# Patient Record
Sex: Male | Born: 1955 | Race: White | Hispanic: No | Marital: Married | Smoking: Never smoker
Health system: Southern US, Community
[De-identification: ages and names within clinical notes are randomized; demographics above are authoritative.]

## PROBLEM LIST (undated history)

## (undated) DIAGNOSIS — E079 Disorder of thyroid, unspecified: Secondary | ICD-10-CM

## (undated) HISTORY — PX: FRACTURE SURGERY: SHX138

## (undated) HISTORY — PX: APPENDECTOMY: SHX54

---

## 2013-01-22 ENCOUNTER — Encounter (HOSPITAL_BASED_OUTPATIENT_CLINIC_OR_DEPARTMENT_OTHER): Payer: Self-pay | Admitting: Emergency Medicine

## 2013-01-22 ENCOUNTER — Emergency Department (HOSPITAL_BASED_OUTPATIENT_CLINIC_OR_DEPARTMENT_OTHER)
Admission: EM | Admit: 2013-01-22 | Discharge: 2013-01-22 | Disposition: A | Discharge status: Home / Self Care | Payer: BC Managed Care – PPO | Attending: Emergency Medicine | Admitting: Emergency Medicine

## 2013-01-22 ENCOUNTER — Emergency Department (HOSPITAL_BASED_OUTPATIENT_CLINIC_OR_DEPARTMENT_OTHER): Payer: BC Managed Care – PPO

## 2013-01-22 DIAGNOSIS — E079 Disorder of thyroid, unspecified: Secondary | ICD-10-CM | POA: Insufficient documentation

## 2013-01-22 DIAGNOSIS — R1013 Epigastric pain: Secondary | ICD-10-CM

## 2013-01-22 DIAGNOSIS — Z79899 Other long term (current) drug therapy: Secondary | ICD-10-CM | POA: Insufficient documentation

## 2013-01-22 DIAGNOSIS — K279 Peptic ulcer, site unspecified, unspecified as acute or chronic, without hemorrhage or perforation: Secondary | ICD-10-CM

## 2013-01-22 HISTORY — DX: Disorder of thyroid, unspecified: E07.9

## 2013-01-22 LAB — CBC WITH DIFFERENTIAL/PLATELET
Basophils Absolute: 0 10*3/uL (ref 0.0–0.1)
Basophils Relative: 0 % (ref 0–1)
EOS PCT: 1 % (ref 0–5)
Eosinophils Absolute: 0.1 10*3/uL (ref 0.0–0.7)
HCT: 44.4 % (ref 39.0–52.0)
Hemoglobin: 16 g/dL (ref 13.0–17.0)
LYMPHS PCT: 13 % (ref 12–46)
Lymphs Abs: 1.4 10*3/uL (ref 0.7–4.0)
MCH: 32.5 pg (ref 26.0–34.0)
MCHC: 36 g/dL (ref 30.0–36.0)
MCV: 90.2 fL (ref 78.0–100.0)
Monocytes Absolute: 0.9 10*3/uL (ref 0.1–1.0)
Monocytes Relative: 8 % (ref 3–12)
NEUTROS ABS: 8.2 10*3/uL — AB (ref 1.7–7.7)
Neutrophils Relative %: 78 % — ABNORMAL HIGH (ref 43–77)
PLATELETS: 181 10*3/uL (ref 150–400)
RBC: 4.92 MIL/uL (ref 4.22–5.81)
RDW: 12.6 % (ref 11.5–15.5)
WBC: 10.6 10*3/uL — ABNORMAL HIGH (ref 4.0–10.5)

## 2013-01-22 LAB — COMPREHENSIVE METABOLIC PANEL
ALBUMIN: 4.4 g/dL (ref 3.5–5.2)
ALT: 36 U/L (ref 0–53)
AST: 36 U/L (ref 0–37)
Alkaline Phosphatase: 47 U/L (ref 39–117)
BUN: 19 mg/dL (ref 6–23)
CO2: 24 mEq/L (ref 19–32)
Calcium: 9.1 mg/dL (ref 8.4–10.5)
Chloride: 101 mEq/L (ref 96–112)
Creatinine, Ser: 0.9 mg/dL (ref 0.50–1.35)
GFR calc Af Amer: >90 mL/min (ref >90)
GFR calc non Af Amer: >90 mL/min (ref >90)
Glucose, Bld: 117 mg/dL — ABNORMAL HIGH (ref 70–99)
Potassium: 3.9 mEq/L (ref 3.7–5.3)
Sodium: 139 mEq/L (ref 137–147)
TOTAL PROTEIN: 7.3 g/dL (ref 6.0–8.3)
Total Bilirubin: 1.1 mg/dL (ref 0.3–1.2)

## 2013-01-22 LAB — LIPASE, BLOOD: LIPASE: 20 U/L (ref 11–59)

## 2013-01-22 LAB — URINALYSIS, ROUTINE W REFLEX MICROSCOPIC
BILIRUBIN URINE: NEGATIVE
Glucose, UA: NEGATIVE mg/dL
Hgb urine dipstick: NEGATIVE
Ketones, ur: 15 mg/dL — AB
Leukocytes, UA: NEGATIVE
NITRITE: NEGATIVE
PROTEIN: NEGATIVE mg/dL
Specific Gravity, Urine: 1.024 (ref 1.005–1.030)
UROBILINOGEN UA: 1 mg/dL (ref 0.0–1.0)
pH: 5.5 (ref 5.0–8.0)

## 2013-01-22 MED ORDER — FAMOTIDINE IN NACL 20-0.9 MG/50ML-% IV SOLN
INTRAVENOUS | Status: AC
  Administered 2013-01-22: 10 mg
  Filled 2013-01-22: qty 50

## 2013-01-22 MED ORDER — GI COCKTAIL ~~LOC~~
30.0000 mL | Freq: Once | ORAL | Status: AC
  Administered 2013-01-22: 30 mL via ORAL
  Filled 2013-01-22: qty 30

## 2013-01-22 MED ORDER — HYDROMORPHONE HCL PF 1 MG/ML IJ SOLN
1.0000 mg | Freq: Once | INTRAMUSCULAR | Status: AC
  Administered 2013-01-22: 1 mg via INTRAVENOUS

## 2013-01-22 MED ORDER — SODIUM CHLORIDE 0.9 % IV SOLN
10.0000 mg | Freq: Once | INTRAVENOUS | Status: AC
  Filled 2013-01-22: qty 1

## 2013-01-22 MED ORDER — PANTOPRAZOLE SODIUM 20 MG PO TBEC: 20.0000 mg | DELAYED_RELEASE_TABLET | Freq: Every day | ORAL | Status: DC

## 2013-01-22 MED ORDER — HYDROMORPHONE HCL PF 1 MG/ML IJ SOLN
1.0000 mg | Freq: Once | INTRAMUSCULAR | Status: AC
  Administered 2013-01-22: 1 mg via INTRAVENOUS
  Filled 2013-01-22: qty 1

## 2013-01-22 MED ORDER — SODIUM CHLORIDE 0.9 % IV BOLUS (SEPSIS)
1000.0000 mL | Freq: Once | INTRAVENOUS | Status: AC
  Administered 2013-01-22: 1000 mL via INTRAVENOUS

## 2013-01-22 MED ORDER — HYDROCODONE-ACETAMINOPHEN 5-325 MG PO TABS: 1.0000 | ORAL_TABLET | Freq: Four times a day (QID) | ORAL | Status: DC | PRN

## 2013-01-22 MED ORDER — MORPHINE SULFATE 4 MG/ML IJ SOLN
4.0000 mg | Freq: Once | INTRAMUSCULAR | Status: AC
  Administered 2013-01-22: 4 mg via INTRAVENOUS
  Filled 2013-01-22: qty 1

## 2013-01-22 MED ORDER — HYDROMORPHONE HCL PF 1 MG/ML IJ SOLN
INTRAMUSCULAR | Status: AC
  Administered 2013-01-22: 1 mg via INTRAVENOUS
  Filled 2013-01-22: qty 1

## 2013-01-22 NOTE — ED Provider Notes (Signed)
CSN: [id number]     Arrival date & time 01/22/13  1938 History  This chart was scribed for Ronnie Kaplan, MD by Ronal Fear, ED Scribe. This patient was seen in room MH03/MH03 and the patient's care was started at 8:29 PM.    Chief Complaint  Patient presents with  . Abdominal Pain   (Consider location/radiation/quality/duration/timing/severity/associated sxs/prior Treatment) The history is provided by the patient. No language interpreter was used.   HPI Comments: [name] is a 58 y.o. male who presents to the Emergency Department complaining of constant sudden onset cramping dull non radiating abdominal pain in the epigastric region with associated belching. No prior occurences, ulcers. Pt has a hx of acid reflux but this pain does not feel the same. Pt states that he takes at least 20 Advil in a week and has been doing so for a number of years. He denies bloody stools, steroid use, abdominal surgeries, liver problems or heavy alcohol use.    Past Medical History  Diagnosis Date  . Thyroid disease    History reviewed. No pertinent past surgical history. History reviewed. No pertinent family history. History  Substance Use Topics  . Smoking status: Never Smoker   . Smokeless tobacco: Not on file  . Alcohol Use: Yes     Comment: occasionally    Review of Systems  Allergies  Review of patient's allergies indicates no known allergies.  Home Medications   Current Outpatient Rx  Name  Route  Sig  Dispense  Refill  . levothyroxine (SYNTHROID, LEVOTHROID) 100 MCG tablet   Oral   Take 100 mcg by mouth daily before breakfast.          BP 136/82  Pulse 63  Temp(Src) 97.9 F (36.6 C) (Oral)  Resp 18  Ht 5\' 9"  (1.753 m)  Wt 200 lb (90.719 kg)  BMI 29.52 kg/m2  SpO2 100% Physical Exam  Nursing note and vitals reviewed. Constitutional: He is oriented to person, place, and time. He appears well-developed and well-nourished. No distress.  HENT:  Head: Normocephalic and  atraumatic.  Eyes: EOM are normal.  Neck: Neck supple. No tracheal deviation present.  Cardiovascular: Normal rate.   Pulmonary/Chest: Effort normal. No respiratory distress.  Abdominal: Soft. There is tenderness. There is no rebound and no guarding.  epigastric tenderness. No flank tenderness  Musculoskeletal: Normal range of motion.  Neurological: He is alert and oriented to person, place, and time.  Skin: Skin is warm and dry.  Psychiatric: He has a normal mood and affect. His behavior is normal.    ED Course  Procedures (including critical care time) DIAGNOSTIC STUDIES: Oxygen Saturation is 100% on RA, normal by my interpretation.    COORDINATION OF CARE: 8:32 PM- Pt advised of plan for treatment including informing the pt of the possible causes for the pain, most likely an ulcer. Pt advised that he should follow up with a gastrologist and will be given medication for pain and pt agrees.    Labs Review Labs Reviewed  URINALYSIS, ROUTINE W REFLEX MICROSCOPIC - Abnormal; Notable for the following:    Ketones, ur 15 (*)    All other components within normal limits   Imaging Review No results found.  EKG Interpretation    Date/Time:  Wednesday January 22 2013 20:01:22 EST Ventricular Rate:  58 PR Interval:  160 QRS Duration: 88 QT Interval:  450 QTC Calculation: 441 R Axis:   11 Text Interpretation:  Sinus bradycardia with sinus arrhythmia Otherwise normal ECG Confirmed  by Ronnie CroftNANAVATI, MD, Ronnie Carr [phone]) on 01/22/2013 9:53:50 PM            MDM  No diagnosis found.  I personally performed the services described in this documentation, which was scribed in my presence. The recorded information has been reviewed and is accurate.   Pt comes in with cc of epigastric abd pain.  DDx includes: Pancreatitis Hepatobiliary pathology including cholecystitis Gastritis/PUD SBO ACS syndrome Aortic Dissection  Pain is epigastric, non radiating, and constant. He has hx of  heavy nsaid use chronically.  Labs are unremarkable, there is no RUQ pain and he has no risk factors for DISSECTION.  Pt informed on return precautions. Will give pain meds, protonix and GI follow up.    Ronnie KaplanAnkit Roseanne Juenger, MD 01/22/13 [phone]

## 2013-01-22 NOTE — Discharge Instructions (Signed)
We saw you in the ER for the abdominal pain. All the results in the ER are normal, labs and imaging. We are not sure what is causing your symptoms - but are highly suspicious that the pain is due to ulcers in the stomach or intestine. Please see the GI doctor as requested.  RETURN TO THE ER IF THERE IS DARK TARRY STOOLS, BLOODY STOOLS OR BLOODY VOMIT.   Diet for Peptic Ulcer Disease Peptic ulcer disease is a term used to describe open sores in the stomach and digestive tract. These sores can be painful, and the pain can be worse when the stomach is empty. These ulcers can lead to bleeding in the esophagus, stomach, and intestines (gastrointestinaltract). Nutrition therapy can help ease the discomfort of peptic ulcer disease.   GENERAL DIET GUIDELINES FOR PEPTIC ULCER DISEASE  Your diet should be rich in fruits, vegetables, and whole grains. It should also be low in fat.   Avoid foods that can irritate your sores.   Avoid foods that are not tolerated or foods that cause pain. This may be different for different people. FOODS AND DRINKS TO AVOID  High-fat dairy and milk products including whole milk, cream, chocolate milk, butter, sour cream, or cream cheese.   High-fat meats and poultry including hot dogs, fried meats or poultry, meats with skin, sausage, or bacon.   Pepper.  Alcohol.  Chocolate.  Tea, coffee, and cola (regular and caffeine).  Lard or hydrogenated oils such as stick margarine.  SAMPLE MEAL PLAN This meal plan is approximately 2000 calories based on https://www.bern[facility].org/ meal planning guidelines.  Breakfast   cup cooked oatmeal.  1 cup strawberries.  1 cup low-fat milk.  1 oz almonds. Snack  1 cup cucumber slices.  6 oz yogurt (made from low-fat or fat-free milk). Lunch  2 slices whole-wheat bread.  2 oz sliced [place].  2 tsp mayonnaise.  1 cup blueberries.  1 cup snap peas. Snack  6 whole-wheat crackers.  1 oz string  cheese. Dinner   cup brown rice.  1 cup mixed veggies.  1 tsp olive oil.  3 oz grilled fish. Document Released: 03/20/2011 Document Reviewed: 03/20/2011 [facility] Patient Information 2014 [place], [place].  Peptic Ulcer A peptic ulcer is a sore in the lining of in your esophagus (esophageal ulcer), stomach (gastric ulcer), or in the first part of your small intestine (duodenal ulcer). The ulcer causes erosion into the deeper tissue. CAUSES  Normally, the lining of the stomach and the small intestine protects itself from the acid that digests food. The protective lining can be damaged by:  An infection caused by a bacterium called Helicobacter pylori (H. pylori).  Regular use of nonsteroidal anti-inflammatory drugs (NSAIDs), such as ibuprofen or aspirin.  Smoking tobacco. Other risk factors include being older than 50, drinking alcohol excessively, and having a family history of ulcer disease.  SYMPTOMS   Burning pain or gnawing in the area between the chest and the belly button.  Heartburn.  Nausea and vomiting.  Bloating. The pain can be worse on an empty stomach and at night. If the ulcer results in bleeding, it can cause:  Black, tarry stools.  Vomiting of bright red blood.  Vomiting of coffee ground looking materials. DIAGNOSIS  A diagnosis is usually made based upon your history and an exam. Other tests and procedures may be performed to find the cause of the ulcer. Finding a cause will help determine the best treatment. Tests and procedures may include:  Blood  tests, stool tests, or breath tests to check for the bacterium H. pylori.  An upper gastrointestinal (GI) series of the esophagus, stomach, and small intestine.  An endoscopy to examine the esophagus, stomach, and small intestine.  A biopsy. TREATMENT  Treatment may include:  Eliminating the cause of the ulcer, such as smoking, NSAIDs, or alcohol.  Medicines to reduce the amount of acid in your  digestive tract.  Antibiotic medicines if the ulcer is caused by the H. pylori bacterium.  An upper endoscopy to treat a bleeding ulcer.  Surgery if the bleeding is severe or if the ulcer created a hole somewhere in the digestive system. HOME CARE INSTRUCTIONS   Avoid tobacco, alcohol, and caffeine. Smoking can increase the acid in the stomach, and continued smoking will impair the healing of ulcers.  Avoid foods and drinks that seem to cause discomfort or aggravate your ulcer.  Only take medicines as directed by your caregiver. Do not substitute over-the-counter medicines for prescription medicines without talking to your caregiver.  Keep any follow-up appointments and tests as directed. SEEK MEDICAL CARE IF:   Your do not improve within 7 days of starting treatment.  You have ongoing indigestion or heartburn. SEEK IMMEDIATE MEDICAL CARE IF:   You have sudden, sharp, or persistent abdominal pain.  You have bloody or dark black, tarry stools.  You vomit blood or vomit that looks like coffee grounds.  You become light headed, weak, or feel faint.  You become sweaty or clammy. MAKE SURE YOU:   Understand these instructions.  Will watch your condition.  Will get help right away if you are not doing well or get worse. Document Released: 12/24/1999 Document Revised: 09/20/2011 Document Reviewed: 07/26/2011 [facility] Patient Information 2014 [place].

## 2013-01-22 NOTE — ED Notes (Signed)
Pt reports mid abdominal pain with sudden onset around 1700, denies nausea, vomiting, diarrhea,

## 2013-01-22 NOTE — ED Notes (Signed)
Pt reports some intermittent sob, denies radiation of pain to back or arm, pt ambulating in room, sitting in different positions attempting to alleviate pain

## 2013-01-22 NOTE — ED Notes (Signed)
MD at bedside. 

## 2013-01-23 ENCOUNTER — Observation Stay (HOSPITAL_COMMUNITY)
Admission: EM | Admit: 2013-01-23 | Discharge: 2013-01-25 | Disposition: A | Discharge status: Home / Self Care | Payer: BC Managed Care – PPO | Attending: Surgery | Admitting: Surgery

## 2013-01-23 ENCOUNTER — Emergency Department (HOSPITAL_COMMUNITY): Payer: BC Managed Care – PPO

## 2013-01-23 ENCOUNTER — Encounter (HOSPITAL_COMMUNITY): Payer: Self-pay | Admitting: Emergency Medicine

## 2013-01-23 DIAGNOSIS — E079 Disorder of thyroid, unspecified: Secondary | ICD-10-CM | POA: Insufficient documentation

## 2013-01-23 DIAGNOSIS — K352 Acute appendicitis with generalized peritonitis, without abscess: Secondary | ICD-10-CM | POA: Insufficient documentation

## 2013-01-23 DIAGNOSIS — K3580 Unspecified acute appendicitis: Secondary | ICD-10-CM

## 2013-01-23 DIAGNOSIS — K3532 Acute appendicitis with perforation and localized peritonitis, without abscess: Secondary | ICD-10-CM | POA: Diagnosis present

## 2013-01-23 DIAGNOSIS — K35209 Acute appendicitis with generalized peritonitis, without abscess, unspecified as to perforation: Principal | ICD-10-CM | POA: Insufficient documentation

## 2013-01-23 LAB — COMPREHENSIVE METABOLIC PANEL
ALK PHOS: 50 U/L (ref 39–117)
ALT: 33 U/L (ref 0–53)
AST: 30 U/L (ref 0–37)
Albumin: 4.3 g/dL (ref 3.5–5.2)
BUN: 12 mg/dL (ref 6–23)
CHLORIDE: 99 meq/L (ref 96–112)
CO2: 24 meq/L (ref 19–32)
CREATININE: 0.83 mg/dL (ref 0.50–1.35)
Calcium: 9.5 mg/dL (ref 8.4–10.5)
GLUCOSE: 106 mg/dL — AB (ref 70–99)
POTASSIUM: 4.1 meq/L (ref 3.7–5.3)
Sodium: 139 mEq/L (ref 137–147)
Total Bilirubin: 2 mg/dL — ABNORMAL HIGH (ref 0.3–1.2)
Total Protein: 7.4 g/dL (ref 6.0–8.3)

## 2013-01-23 LAB — CBC WITH DIFFERENTIAL/PLATELET
Basophils Absolute: 0 10*3/uL (ref 0.0–0.1)
Basophils Relative: 0 % (ref 0–1)
Eosinophils Absolute: 0.1 10*3/uL (ref 0.0–0.7)
Eosinophils Relative: 0 % (ref 0–5)
HCT: 45.6 % (ref 39.0–52.0)
Hemoglobin: 16.3 g/dL (ref 13.0–17.0)
Lymphocytes Relative: 11 % — ABNORMAL LOW (ref 12–46)
Lymphs Abs: 1.6 10*3/uL (ref 0.7–4.0)
MCH: 33.3 pg (ref 26.0–34.0)
MCHC: 35.7 g/dL (ref 30.0–36.0)
MCV: 93.3 fL (ref 78.0–100.0)
Monocytes Absolute: 1.4 10*3/uL — ABNORMAL HIGH (ref 0.1–1.0)
Monocytes Relative: 10 % (ref 3–12)
Neutro Abs: 10.7 10*3/uL — ABNORMAL HIGH (ref 1.7–7.7)
Neutrophils Relative %: 78 % — ABNORMAL HIGH (ref 43–77)
Platelets: 189 10*3/uL (ref 150–400)
RBC: 4.89 MIL/uL (ref 4.22–5.81)
RDW: 13.2 % (ref 11.5–15.5)
WBC: 13.8 10*3/uL — ABNORMAL HIGH (ref 4.0–10.5)

## 2013-01-23 LAB — URINALYSIS, ROUTINE W REFLEX MICROSCOPIC
Bilirubin Urine: NEGATIVE
Glucose, UA: NEGATIVE mg/dL
Hgb urine dipstick: NEGATIVE
Ketones, ur: 40 mg/dL — AB
Leukocytes, UA: NEGATIVE
Nitrite: NEGATIVE
Protein, ur: NEGATIVE mg/dL
Specific Gravity, Urine: 1.017 (ref 1.005–1.030)
Urobilinogen, UA: 0.2 mg/dL (ref 0.0–1.0)
pH: 5.5 (ref 5.0–8.0)

## 2013-01-23 LAB — LIPASE, BLOOD: LIPASE: 17 U/L (ref 11–59)

## 2013-01-23 MED ORDER — IOHEXOL 300 MG/ML  SOLN
25.0000 mL | Freq: Once | INTRAMUSCULAR | Status: AC | PRN
  Administered 2013-01-23: 25 mL via ORAL

## 2013-01-23 MED ORDER — SODIUM CHLORIDE 0.9 % IV BOLUS (SEPSIS)
1000.0000 mL | Freq: Once | INTRAVENOUS | Status: AC
  Administered 2013-01-23: 1000 mL via INTRAVENOUS

## 2013-01-23 MED ORDER — LORAZEPAM 2 MG/ML IJ SOLN
1.0000 mg | Freq: Once | INTRAMUSCULAR | Status: AC
  Administered 2013-01-23: 1 mg via INTRAVENOUS
  Filled 2013-01-23: qty 1

## 2013-01-23 MED ORDER — IOHEXOL 300 MG/ML  SOLN
100.0000 mL | Freq: Once | INTRAMUSCULAR | Status: AC | PRN
  Administered 2013-01-23: 100 mL via INTRAVENOUS

## 2013-01-23 MED ORDER — HYDROMORPHONE HCL PF 1 MG/ML IJ SOLN
1.0000 mg | Freq: Once | INTRAMUSCULAR | Status: AC
  Administered 2013-01-23: 1 mg via INTRAVENOUS
  Filled 2013-01-23: qty 1

## 2013-01-23 MED ORDER — MORPHINE SULFATE 4 MG/ML IJ SOLN
6.0000 mg | Freq: Once | INTRAMUSCULAR | Status: DC
  Filled 2013-01-23: qty 2

## 2013-01-23 MED ORDER — HYDROMORPHONE HCL PF 1 MG/ML IJ SOLN
1.0000 mg | Freq: Once | INTRAMUSCULAR | Status: AC
Start: 2013-01-23 — End: 2013-01-23
  Administered 2013-01-23: 1 mg via INTRAVENOUS
  Filled 2013-01-23: qty 1

## 2013-01-23 MED ORDER — OXYCODONE-ACETAMINOPHEN 5-325 MG PO TABS
1.0000 | ORAL_TABLET | Freq: Once | ORAL | Status: AC
  Administered 2013-01-23: 1 via ORAL
  Filled 2013-01-23: qty 1

## 2013-01-23 NOTE — ED Notes (Signed)
Patient is screaming out in pain and rolling from side to side in bed. PA notified.

## 2013-01-23 NOTE — ED Notes (Signed)
Family up to nurse first. Pt reporting severe abdominal pain. Will put in protocol pain meds.

## 2013-01-23 NOTE — ED Notes (Signed)
CT paged. 

## 2013-01-23 NOTE — ED Provider Notes (Signed)
CSN: [id number]     Arrival date & time 01/23/13  1606 History   First MD Initiated Contact with Patient 01/23/13 2145     Chief Complaint  Patient presents with  . Abdominal Pain   (Consider location/radiation/quality/duration/timing/severity/associated sxs/prior Treatment) HPI Comments: Patient is a 58 year old male with a past medical history of thyroid disease who presents with abdominal pain for the past 2 days. The pain started in his epigastrium yesterday and has now migrated to his RLQ. The pain is now located in RLQ and does not radiate. The pain is described as sharp and severe. The pain started gradually and progressively worsened since the onset. No alleviating/aggravating factors. The patient has tried Protonix for symptoms without relief. Associated symptoms include nausea and vomiting. Patient denies fever, headache, diarrhea, chest pain, SOB, dysuria, constipation. Patient has no previous abdominal surgery.     Past Medical History  Diagnosis Date  . Thyroid disease    History reviewed. No pertinent past surgical history. History reviewed. No pertinent family history. History  Substance Use Topics  . Smoking status: Never Smoker   . Smokeless tobacco: Not on file  . Alcohol Use: Yes     Comment: occasionally    Review of Systems  Constitutional: Negative for fever, chills and fatigue.  HENT: Negative for trouble swallowing.   Eyes: Negative for visual disturbance.  Respiratory: Negative for shortness of breath.   Cardiovascular: Negative for chest pain and palpitations.  Gastrointestinal: Positive for nausea, vomiting and abdominal pain. Negative for diarrhea.  Genitourinary: Negative for dysuria and difficulty urinating.  Musculoskeletal: Negative for arthralgias and neck pain.  Skin: Negative for color change.  Neurological: Negative for dizziness and weakness.  Psychiatric/Behavioral: Negative for dysphoric mood.    Allergies  Review of patient's allergies  indicates no known allergies.  Home Medications   Current Outpatient Rx  Name  Route  Sig  Dispense  Refill  . Doxylamine Succinate, Sleep, (SLEEP AID PO)   Oral   Take 1 tablet by mouth at bedtime as needed (for sleep).         Marland Kitchen HYDROcodone-acetaminophen (NORCO/VICODIN) 5-325 MG per tablet   Oral   Take 1 tablet by mouth every 6 (six) hours as needed.   15 tablet   0   . ibuprofen (ADVIL,MOTRIN) 200 MG tablet   Oral   Take 600 mg by mouth every 6 (six) hours as needed for moderate pain.         Marland Kitchen levothyroxine (SYNTHROID, LEVOTHROID) 88 MCG tablet   Oral   Take 88 mcg by mouth daily before breakfast.         . oxyCODONE-acetaminophen (PERCOCET/ROXICET) 5-325 MG per tablet   Oral   Take 1 tablet by mouth once.         . pantoprazole (PROTONIX) 20 MG tablet   Oral   Take 1 tablet (20 mg total) by mouth daily.   15 tablet   0    BP 134/79  Pulse 73  Temp(Src) 97.9 F (36.6 C) (Oral)  Resp 18  Ht 5' 9.5" (1.765 m)  Wt 200 lb (90.719 kg)  BMI 29.12 kg/m2  SpO2 98% Physical Exam  Nursing note and vitals reviewed. Constitutional: He is oriented to person, place, and time. He appears well-developed and well-nourished. No distress.  HENT:  Head: Normocephalic and atraumatic.  Eyes: Conjunctivae and EOM are normal.  Neck: Normal range of motion.  Cardiovascular: Normal rate and regular rhythm.  Exam reveals no  gallop and no friction rub.   No murmur heard. Pulmonary/Chest: Effort normal and breath sounds normal. He has no wheezes. He has no rales. He exhibits no tenderness.  Abdominal: Soft. He exhibits no distension. There is tenderness. There is guarding. There is no rebound.  RLQ tenderness to palpation at McBurney's point. Guarding on exam with palpation of RLQ. No other focal tenderness or peritoneal signs.   Musculoskeletal: Normal range of motion.  Neurological: He is alert and oriented to person, place, and time. Coordination normal.  Speech is  goal-oriented. Moves limbs without ataxia.   Skin: Skin is warm and dry.  Psychiatric: He has a normal mood and affect. His behavior is normal.    ED Course  Procedures (including critical care time) Labs Review Labs Reviewed  CBC WITH DIFFERENTIAL - Abnormal; Notable for the following:    WBC 13.8 (*)    Neutrophils Relative % 78 (*)    Neutro Abs 10.7 (*)    Lymphocytes Relative 11 (*)    Monocytes Absolute 1.4 (*)    All other components within normal limits  COMPREHENSIVE METABOLIC PANEL - Abnormal; Notable for the following:    Glucose, Bld 106 (*)    Total Bilirubin 2.0 (*)    All other components within normal limits  URINALYSIS, ROUTINE W REFLEX MICROSCOPIC - Abnormal; Notable for the following:    Ketones, ur 40 (*)    All other components within normal limits  LIPASE, BLOOD   Imaging Review Ct Abdomen Pelvis W Contrast  01/23/2013   CLINICAL DATA:  Right lower quadrant pain, nausea vomiting  EXAM: CT ABDOMEN AND PELVIS WITH CONTRAST  TECHNIQUE: Multidetector CT imaging of the abdomen and pelvis was performed using the standard protocol following bolus administration of intravenous contrast.  CONTRAST:  OMNIPAQUE IOHEXOL 300 MG/ML  SOLN  COMPARISON:  None available  FINDINGS: Bibasilar atelectasis is present within the visualized lung bases.  The liver demonstrates a normal contrast enhanced appearance. Gallbladder is within normal limits. The spleen, adrenal glands, and pancreas demonstrate a normal contrast enhanced appearance.  The kidneys are equal in size with symmetric enhancement. Mild fullness of the renal collecting systems is noted bilaterally without evidence of hydronephrosis. No nephrolithiasis or focal enhancing renal mass.  No evidence of bowel obstruction. The appendix is dilated up to 1.2 cm with extensive inflammatory fat stranding seen within the periappendiceal fat. Findings are consistent with acute appendicitis. A few small locular sub gas are seen  within this region, which may overall lie in and extra luminal location, suggestive of perforation (series 2, image 62). No loculated fluid collection identified. Small amount of free fluid seen within the right pericolic gutter.  No other inflammatory changes seen within the bowels.  Bladder and prostate are unremarkable.  Normal intravascular enhancement seen within the abdomen and pelvis.  No pathologically enlarged intra-abdominal or pelvic lymph nodes identified.  No focal lytic or blastic osseous lesions.  IMPRESSION: Findings consistent with acute appendicitis. Several small locules of gas adjacent to the dilated appendix are likely extraluminal in location, suggestive of perforation. No loculated fluid collection identified.  Critical Value/emergent results were called by telephone at the time of interpretation on 01/23/2013 at 11:48 PM to Dr. Marena Chancy , who verbally acknowledged these results.   Electronically Signed   By: Rise Mu M.D.   On: 01/23/2013 23:49   Dg Abd Acute W/chest  01/22/2013   CLINICAL DATA:  Epigastric abdominal pain earlier today. No associated nausea, vomiting,  diarrhea, or constipation. Current history of unspecified thyroid disease.  EXAM: ACUTE ABDOMEN SERIES (ABDOMEN 2 VIEW & CHEST 1 VIEW)  COMPARISON:  None.  FINDINGS: Bowel gas pattern unremarkable without evidence of obstruction or significant ileus. No evidence of free air or significant air-fluid levels on the erect image. Expected stool burden in the colon. Phleboliths in both sides of the pelvis. No visible opaque urinary tract calculi. Mild degenerative changes involving the lumbar spine.  Cardiac silhouette upper normal in size. Hilar and mediastinal contours otherwise unremarkable. Linear atelectasis or scarring in the lung bases. Lungs otherwise clear. No localized airspace consolidation. No pleural effusions. No pneumothorax. Normal pulmonary vascularity.  IMPRESSION: 1. No acute abdominal  abnormality. 2. Linear atelectasis or scar in the lung bases. No acute cardiopulmonary disease otherwise.   Electronically Signed   By: Hulan Saashomas  Lawrence M.D.   On: 01/22/2013 21:26    EKG Interpretation   None       MDM   1. Acute appendicitis     10:33 PM CT pending. Elevated WBC noted on labs without other acute changes. Vitals stable and patient afebrile. Patient will have dilaudid for pain.   12:17 AM Patient has acute appendicitis with perforation. Dr. Magnus IvanBlackman will admit the patient and take him to surgery.     Emilia BeckKaitlyn Leonela Kivi, PA-C 01/24/13 [phone]

## 2013-01-23 NOTE — ED Notes (Signed)
Pt reports going to hospital last night for mid abd pain that started at 5pm, was told it was an ulcer and discharged home. Pt reports now pain is migrated to RLQ. Went to pcp and told to come here to r/o appendicitis. Reports n/v earlier today.

## 2013-01-24 ENCOUNTER — Encounter (HOSPITAL_COMMUNITY): Payer: BC Managed Care – PPO | Admitting: Certified Registered"

## 2013-01-24 ENCOUNTER — Emergency Department (HOSPITAL_COMMUNITY): Payer: BC Managed Care – PPO

## 2013-01-24 ENCOUNTER — Other Ambulatory Visit (INDEPENDENT_AMBULATORY_CARE_PROVIDER_SITE_OTHER): Payer: Self-pay | Admitting: Surgery

## 2013-01-24 ENCOUNTER — Emergency Department (HOSPITAL_COMMUNITY): Payer: BC Managed Care – PPO | Admitting: Certified Registered"

## 2013-01-24 ENCOUNTER — Encounter (HOSPITAL_COMMUNITY): Payer: Self-pay | Admitting: Certified Registered"

## 2013-01-24 ENCOUNTER — Encounter (HOSPITAL_COMMUNITY)
Admission: EM | Disposition: A | Discharge status: Home / Self Care | Payer: Self-pay | Source: Home / Self Care | Attending: Emergency Medicine

## 2013-01-24 DIAGNOSIS — K3532 Acute appendicitis with perforation and localized peritonitis, without abscess: Secondary | ICD-10-CM | POA: Diagnosis present

## 2013-01-24 DIAGNOSIS — K352 Acute appendicitis with generalized peritonitis, without abscess: Secondary | ICD-10-CM

## 2013-01-24 HISTORY — PX: LAPAROSCOPIC APPENDECTOMY: SHX408

## 2013-01-24 LAB — CBC
HEMATOCRIT: 40 % (ref 39.0–52.0)
Hemoglobin: 14.1 g/dL (ref 13.0–17.0)
MCH: 32.8 pg (ref 26.0–34.0)
MCHC: 35.3 g/dL (ref 30.0–36.0)
MCV: 93 fL (ref 78.0–100.0)
PLATELETS: 151 10*3/uL (ref 150–400)
RBC: 4.3 MIL/uL (ref 4.22–5.81)
RDW: 13.3 % (ref 11.5–15.5)
WBC: 13.1 10*3/uL — ABNORMAL HIGH (ref 4.0–10.5)

## 2013-01-24 SURGERY — APPENDECTOMY, LAPAROSCOPIC
Anesthesia: General | Site: Abdomen

## 2013-01-24 MED ORDER — BUPIVACAINE-EPINEPHRINE 0.5% -1:200000 IJ SOLN
INTRAMUSCULAR | Status: DC | PRN
  Administered 2013-01-24: 30 mL

## 2013-01-24 MED ORDER — ACETAMINOPHEN 325 MG PO TABS: 650.0000 mg | ORAL_TABLET | Freq: Four times a day (QID) | ORAL | Status: DC | PRN

## 2013-01-24 MED ORDER — PIPERACILLIN-TAZOBACTAM 3.375 G IVPB
3.3750 g | Freq: Three times a day (TID) | INTRAVENOUS | Status: DC
  Administered 2013-01-24 – 2013-01-25 (×4): 3.375 g via INTRAVENOUS
  Filled 2013-01-24 (×5): qty 50

## 2013-01-24 MED ORDER — MIDAZOLAM HCL 5 MG/5ML IJ SOLN
INTRAMUSCULAR | Status: DC | PRN
  Administered 2013-01-24: 2 mg via INTRAVENOUS

## 2013-01-24 MED ORDER — HYDROMORPHONE HCL PF 1 MG/ML IJ SOLN
INTRAMUSCULAR | Status: AC
  Administered 2013-01-24: 0.5 mg via INTRAVENOUS
  Filled 2013-01-24: qty 1

## 2013-01-24 MED ORDER — LACTATED RINGERS IV SOLN: INTRAVENOUS | Status: DC | PRN

## 2013-01-24 MED ORDER — OXYCODONE-ACETAMINOPHEN 5-325 MG PO TABS
1.0000 | ORAL_TABLET | ORAL | Status: DC | PRN
  Administered 2013-01-24: 1 via ORAL
  Administered 2013-01-24 – 2013-01-25 (×5): 2 via ORAL
  Filled 2013-01-24 (×5): qty 2
  Filled 2013-01-24 (×2): qty 1

## 2013-01-24 MED ORDER — SUCCINYLCHOLINE CHLORIDE 20 MG/ML IJ SOLN
INTRAMUSCULAR | Status: DC | PRN
  Administered 2013-01-24: 125 mg via INTRAVENOUS

## 2013-01-24 MED ORDER — LEVOTHYROXINE SODIUM 88 MCG PO TABS
88.0000 ug | ORAL_TABLET | Freq: Every day | ORAL | Status: DC
  Administered 2013-01-24 – 2013-01-25 (×2): 88 ug via ORAL
  Filled 2013-01-24 (×4): qty 1

## 2013-01-24 MED ORDER — BUPIVACAINE-EPINEPHRINE (PF) 0.5% -1:200000 IJ SOLN
INTRAMUSCULAR | Status: AC
  Filled 2013-01-24: qty 10

## 2013-01-24 MED ORDER — SODIUM CHLORIDE 0.9 % IV SOLN
INTRAVENOUS | Status: DC | PRN
  Administered 2013-01-24: 01:00:00 via INTRAVENOUS

## 2013-01-24 MED ORDER — ONDANSETRON HCL 4 MG/2ML IJ SOLN: 4.0000 mg | Freq: Four times a day (QID) | INTRAMUSCULAR | Status: DC | PRN

## 2013-01-24 MED ORDER — NEOSTIGMINE METHYLSULFATE 1 MG/ML IJ SOLN
INTRAMUSCULAR | Status: DC | PRN
Start: 2013-01-24 — End: 2013-01-24
  Administered 2013-01-24: 3 mg via INTRAVENOUS

## 2013-01-24 MED ORDER — PIPERACILLIN-TAZOBACTAM 3.375 G IVPB 30 MIN
3.3750 g | Freq: Once | INTRAVENOUS | Status: AC
  Administered 2013-01-24: 3.375 g via INTRAVENOUS
  Filled 2013-01-24: qty 50

## 2013-01-24 MED ORDER — OXYCODONE HCL 5 MG/5ML PO SOLN: 5.0000 mg | Freq: Once | ORAL | Status: DC | PRN

## 2013-01-24 MED ORDER — SODIUM CHLORIDE 0.9 % IV SOLN
Freq: Once | INTRAVENOUS | Status: AC
  Administered 2013-01-24: 01:00:00 via INTRAVENOUS

## 2013-01-24 MED ORDER — OXYCODONE HCL 5 MG PO TABS: 5.0000 mg | ORAL_TABLET | Freq: Once | ORAL | Status: DC | PRN

## 2013-01-24 MED ORDER — ONDANSETRON HCL 4 MG PO TABS: 4.0000 mg | ORAL_TABLET | Freq: Four times a day (QID) | ORAL | Status: DC | PRN

## 2013-01-24 MED ORDER — SODIUM CHLORIDE 0.9 % IR SOLN
Status: DC | PRN
  Administered 2013-01-24: 1000 mL

## 2013-01-24 MED ORDER — PROPOFOL 10 MG/ML IV BOLUS
INTRAVENOUS | Status: DC | PRN
  Administered 2013-01-24: 120 mg via INTRAVENOUS

## 2013-01-24 MED ORDER — HYDROMORPHONE HCL PF 1 MG/ML IJ SOLN: 0.2500 mg | INTRAMUSCULAR | Status: DC | PRN

## 2013-01-24 MED ORDER — GLYCOPYRROLATE 0.2 MG/ML IJ SOLN
INTRAMUSCULAR | Status: DC | PRN
  Administered 2013-01-24: 0.4 mg via INTRAVENOUS

## 2013-01-24 MED ORDER — HYDROMORPHONE HCL PF 1 MG/ML IJ SOLN: 1.0000 mg | INTRAMUSCULAR | Status: DC | PRN

## 2013-01-24 MED ORDER — DEXAMETHASONE SODIUM PHOSPHATE 4 MG/ML IJ SOLN
INTRAMUSCULAR | Status: DC | PRN
  Administered 2013-01-24: 4 mg via INTRAVENOUS

## 2013-01-24 MED ORDER — MEPERIDINE HCL 25 MG/ML IJ SOLN: 6.2500 mg | INTRAMUSCULAR | Status: DC | PRN

## 2013-01-24 MED ORDER — ROCURONIUM BROMIDE 100 MG/10ML IV SOLN
INTRAVENOUS | Status: DC | PRN
  Administered 2013-01-24: 20 mg via INTRAVENOUS

## 2013-01-24 MED ORDER — ONDANSETRON HCL 4 MG/2ML IJ SOLN
4.0000 mg | Freq: Once | INTRAMUSCULAR | Status: DC | PRN
Start: 2013-01-24 — End: 2013-01-24

## 2013-01-24 MED ORDER — KETOROLAC TROMETHAMINE 30 MG/ML IJ SOLN
INTRAMUSCULAR | Status: DC | PRN
  Administered 2013-01-24: 30 mg via INTRAVENOUS

## 2013-01-24 MED ORDER — ONDANSETRON HCL 4 MG/2ML IJ SOLN
INTRAMUSCULAR | Status: DC | PRN
  Administered 2013-01-24: 4 mg via INTRAVENOUS

## 2013-01-24 MED ORDER — LIDOCAINE HCL (CARDIAC) 20 MG/ML IV SOLN
INTRAVENOUS | Status: DC | PRN
  Administered 2013-01-24: 100 mg via INTRAVENOUS

## 2013-01-24 MED ORDER — ENOXAPARIN SODIUM 40 MG/0.4ML ~~LOC~~ SOLN
40.0000 mg | SUBCUTANEOUS | Status: DC
  Filled 2013-01-24 (×2): qty 0.4

## 2013-01-24 MED ORDER — POTASSIUM CHLORIDE IN NACL 20-0.9 MEQ/L-% IV SOLN
INTRAVENOUS | Status: DC
  Administered 2013-01-24 (×2): via INTRAVENOUS
  Filled 2013-01-24 (×7): qty 1000

## 2013-01-24 MED ORDER — IBUPROFEN 600 MG PO TABS
600.0000 mg | ORAL_TABLET | Freq: Four times a day (QID) | ORAL | Status: DC | PRN
  Filled 2013-01-24: qty 1

## 2013-01-24 MED ORDER — SUFENTANIL CITRATE 50 MCG/ML IV SOLN
INTRAVENOUS | Status: DC | PRN
  Administered 2013-01-24: 20 ug via INTRAVENOUS

## 2013-01-24 MED ORDER — SODIUM CHLORIDE 0.9 % IV SOLN
1.0000 g | INTRAVENOUS | Status: AC
  Administered 2013-01-24: 1 g via INTRAVENOUS
  Filled 2013-01-24 (×2): qty 1

## 2013-01-24 MED ORDER — PANTOPRAZOLE SODIUM 20 MG PO TBEC
20.0000 mg | DELAYED_RELEASE_TABLET | Freq: Every day | ORAL | Status: DC
  Administered 2013-01-24 – 2013-01-25 (×2): 20 mg via ORAL
  Filled 2013-01-24 (×2): qty 1

## 2013-01-24 SURGICAL SUPPLY — 42 items
APPLIER CLIP LOGIC TI 5 (MISCELLANEOUS) IMPLANT
APPLIER CLIP ROT 10 11.4 M/L (STAPLE)
BANDAGE ADHESIVE 1X3 (GAUZE/BANDAGES/DRESSINGS) ×9 IMPLANT
BENZOIN TINCTURE PRP APPL 2/3 (GAUZE/BANDAGES/DRESSINGS) ×3 IMPLANT
CANISTER SUCTION 2500CC (MISCELLANEOUS) ×3 IMPLANT
CHLORAPREP W/TINT 26ML (MISCELLANEOUS) ×3 IMPLANT
CLIP APPLIE ROT 10 11.4 M/L (STAPLE) IMPLANT
CLOSURE WOUND 1/2 X4 (GAUZE/BANDAGES/DRESSINGS) ×1
COVER SURGICAL LIGHT HANDLE (MISCELLANEOUS) ×3 IMPLANT
CUTTER LINEAR ENDO 35 ETS (STAPLE) ×3 IMPLANT
CUTTER LINEAR ENDO 35 ETS TH (STAPLE) IMPLANT
DECANTER SPIKE VIAL GLASS SM (MISCELLANEOUS) IMPLANT
DRAIN CHANNEL 19F RND (DRAIN) ×3 IMPLANT
ELECT REM PT RETURN 9FT ADLT (ELECTROSURGICAL) ×3
ELECTRODE REM PT RTRN 9FT ADLT (ELECTROSURGICAL) ×1 IMPLANT
ENDOLOOP SUT PDS II  0 18 (SUTURE)
ENDOLOOP SUT PDS II 0 18 (SUTURE) IMPLANT
EVACUATOR SILICONE 100CC (DRAIN) ×3 IMPLANT
GLOVE BIO SURGEON STRL SZ7 (GLOVE) ×3 IMPLANT
GLOVE SURG SIGNA 7.5 PF LTX (GLOVE) ×3 IMPLANT
GOWN STRL NON-REIN LRG LVL3 (GOWN DISPOSABLE) ×3 IMPLANT
GOWN STRL REIN XL XLG (GOWN DISPOSABLE) ×3 IMPLANT
KIT BASIN OR (CUSTOM PROCEDURE TRAY) ×3 IMPLANT
KIT ROOM TURNOVER OR (KITS) ×3 IMPLANT
NS IRRIG 1000ML POUR BTL (IV SOLUTION) ×3 IMPLANT
PAD ARMBOARD 7.5X6 YLW CONV (MISCELLANEOUS) ×6 IMPLANT
POUCH SPECIMEN RETRIEVAL 10MM (ENDOMECHANICALS) ×3 IMPLANT
RELOAD /EVU35 (ENDOMECHANICALS) IMPLANT
RELOAD CUTTER ETS 35MM STAND (ENDOMECHANICALS) IMPLANT
SCALPEL HARMONIC ACE (MISCELLANEOUS) ×3 IMPLANT
SET IRRIG TUBING LAPAROSCOPIC (IRRIGATION / IRRIGATOR) ×3 IMPLANT
SLEEVE ENDOPATH XCEL 5M (ENDOMECHANICALS) ×3 IMPLANT
SPECIMEN JAR SMALL (MISCELLANEOUS) ×3 IMPLANT
STRIP CLOSURE SKIN 1/2X4 (GAUZE/BANDAGES/DRESSINGS) ×2 IMPLANT
SUT ETHILON 3 0 FSL (SUTURE) ×3 IMPLANT
SUT MON AB 4-0 PC3 18 (SUTURE) ×3 IMPLANT
TOWEL OR 17X24 6PK STRL BLUE (TOWEL DISPOSABLE) ×3 IMPLANT
TOWEL OR 17X26 10 PK STRL BLUE (TOWEL DISPOSABLE) ×3 IMPLANT
TRAY LAPAROSCOPIC (CUSTOM PROCEDURE TRAY) ×3 IMPLANT
TROCAR XCEL BLUNT TIP 100MML (ENDOMECHANICALS) ×3 IMPLANT
TROCAR XCEL NON-BLD 5MMX100MML (ENDOMECHANICALS) ×3 IMPLANT
WATER STERILE IRR 1000ML POUR (IV SOLUTION) IMPLANT

## 2013-01-24 NOTE — ED Provider Notes (Signed)
Medical screening examination/treatment/procedure(s) were conducted as a shared visit with non-physician practitioner(s) and myself.  I personally evaluated the patient during the encounter.  EKG Interpretation   None        Patient here with RLQ pain. Worsening while here. No fevers. Here with severe RLQ on exam. Controlled with dilaudid and ativan. CT shows stranding and likely perforated appendicits. Scan reviewed by me. Admitted to surgery.  Dagmar HaitWilliam Jasiri Hanawalt, MD 01/24/13 [phone]

## 2013-01-24 NOTE — Progress Notes (Signed)
Patient ID: Ronnie Carr, male   DOB: [birth year], 58 y.o.   MRN: [id number] Day of Surgery  Subjective: Pt feels ok right now given recent surgery.  He just ate clear liquids for breakfast with no nausea.  His pain is controlled.  Objective: Vital signs in last 24 hours: Temp:  [97.9 F (36.6 C)-99.4 F (37.4 C)] 98 F (36.7 C) (01/16 0500) Pulse Rate:  [70-92] 78 (01/16 0500) Resp:  [11-20] 20 (01/16 0500) BP: (118-138)/(57-86) 118/65 mmHg (01/16 0500) SpO2:  [92 %-100 %] 98 % (01/16 0500) Weight:  [200 lb (90.719 kg)] 200 lb (90.719 kg) (01/16 0314) Last BM Date: 01/21/13  Intake/Output from previous day: 01/15 0701 - 01/16 0700 In: 2072.5 [I.V.:2037.5] Out: 50 [Urine:50] Intake/Output this shift:    PE: Abd: soft, appropriately tender, +BS, ND, incisions c/d/i, JP drain with cloudy bloody output. Heart: regular Lungs: CTAB  Lab Results:   Recent Labs  01/22/13 2045 01/23/13 1648  WBC 10.6* 13.8*  HGB 16.0 16.3  HCT 44.4 45.6  PLT 181 189   BMET  Recent Labs  01/22/13 2045 01/23/13 1648  NA 139 139  K 3.9 4.1  CL 101 99  CO2 24 24  GLUCOSE 117* 106*  BUN 19 12  CREATININE 0.90 0.83  CALCIUM 9.1 9.5   PT/INR No results found for this basename: LABPROT, INR,  in the last 72 hours CMP     Component Value Date/Time   NA 139 01/23/2013 1648   K 4.1 01/23/2013 1648   CL 99 01/23/2013 1648   CO2 24 01/23/2013 1648   GLUCOSE 106* 01/23/2013 1648   BUN 12 01/23/2013 1648   CREATININE 0.83 01/23/2013 1648   CALCIUM 9.5 01/23/2013 1648   PROT 7.4 01/23/2013 1648   ALBUMIN 4.3 01/23/2013 1648   AST 30 01/23/2013 1648   ALT 33 01/23/2013 1648   ALKPHOS 50 01/23/2013 1648   BILITOT 2.0* 01/23/2013 1648   GFRNONAA >90 01/23/2013 1648   GFRAA >90 01/23/2013 1648   Lipase     Component Value Date/Time   LIPASE 17 01/23/2013 1648       Studies/Results: Dg Chest 2 View  01/24/2013   CLINICAL DATA:  Preoperative respiratory evaluation prior to appendectomy.  EXAM:  CHEST  2 VIEW  COMPARISON:  One view chest x-ray 01/22/2013.  FINDINGS: Cardiomediastinal silhouette unremarkable. Linear atelectasis in the right middle lobe and lingula. Suboptimal inspiration with atelectasis in the lower lobes. No confluent airspace consolidation. No pleural effusions. Normal pulmonary vascularity. Visualized bony thorax intact.  IMPRESSION: Suboptimal inspiration with atelectasis in the lower lobes, right middle lobe, and lingula. No acute cardiopulmonary disease otherwise.   Electronically Signed   By: Hulan Saas M.D.   On: 01/24/2013 01:32   Ct Abdomen Pelvis W Contrast  01/23/2013   CLINICAL DATA:  Right lower quadrant pain, nausea vomiting  EXAM: CT ABDOMEN AND PELVIS WITH CONTRAST  TECHNIQUE: Multidetector CT imaging of the abdomen and pelvis was performed using the standard protocol following bolus administration of intravenous contrast.  CONTRAST:  OMNIPAQUE IOHEXOL 300 MG/ML  SOLN  COMPARISON:  None available  FINDINGS: Bibasilar atelectasis is present within the visualized lung bases.  The liver demonstrates a normal contrast enhanced appearance. Gallbladder is within normal limits. The spleen, adrenal glands, and pancreas demonstrate a normal contrast enhanced appearance.  The kidneys are equal in size with symmetric enhancement. Mild fullness of the renal collecting systems is noted bilaterally without evidence of hydronephrosis. No nephrolithiasis or  focal enhancing renal mass.  No evidence of bowel obstruction. The appendix is dilated up to 1.2 cm with extensive inflammatory fat stranding seen within the periappendiceal fat. Findings are consistent with acute appendicitis. A few small locular sub gas are seen within this region, which may overall lie in and extra luminal location, suggestive of perforation (series 2, image 62). No loculated fluid collection identified. Small amount of free fluid seen within the right pericolic gutter.  No other inflammatory changes  seen within the bowels.  Bladder and prostate are unremarkable.  Normal intravascular enhancement seen within the abdomen and pelvis.  No pathologically enlarged intra-abdominal or pelvic lymph nodes identified.  No focal lytic or blastic osseous lesions.  IMPRESSION: Findings consistent with acute appendicitis. Several small locules of gas adjacent to the dilated appendix are likely extraluminal in location, suggestive of perforation. No loculated fluid collection identified.  Critical Value/emergent results were called by telephone at the time of interpretation on 01/23/2013 at 11:48 PM to Dr. Marena ChancyWILLIAM WALDEN , who verbally acknowledged these results.   Electronically Signed   By: Rise MuBenjamin  McClintock M.D.   On: 01/23/2013 23:49   Dg Abd Acute W/chest  01/22/2013   CLINICAL DATA:  Epigastric abdominal pain earlier today. No associated nausea, vomiting, diarrhea, or constipation. Current history of unspecified thyroid disease.  EXAM: ACUTE ABDOMEN SERIES (ABDOMEN 2 VIEW & CHEST 1 VIEW)  COMPARISON:  None.  FINDINGS: Bowel gas pattern unremarkable without evidence of obstruction or significant ileus. No evidence of free air or significant air-fluid levels on the erect image. Expected stool burden in the colon. Phleboliths in both sides of the pelvis. No visible opaque urinary tract calculi. Mild degenerative changes involving the lumbar spine.  Cardiac silhouette upper normal in size. Hilar and mediastinal contours otherwise unremarkable. Linear atelectasis or scarring in the lung bases. Lungs otherwise clear. No localized airspace consolidation. No pleural effusions. No pneumothorax. Normal pulmonary vascularity.  IMPRESSION: 1. No acute abdominal abnormality. 2. Linear atelectasis or scar in the lung bases. No acute cardiopulmonary disease otherwise.   Electronically Signed   By: Hulan Saashomas  Lawrence M.D.   On: 01/22/2013 21:26    Anti-infectives: Anti-infectives   Start     Dose/Rate Route Frequency Ordered  Stop   01/24/13 0800  piperacillin-tazobactam (ZOSYN) IVPB 3.375 g     3.375 g 12.5 mL/hr over 240 Minutes Intravenous Every 8 hours 01/24/13 0221     01/24/13 0600  ertapenem (INVANZ) 1 g in sodium chloride 0.9 % 50 mL IVPB     1 g 100 mL/hr over 30 Minutes Intravenous On call to O.R. 01/24/13 0012 01/24/13 0057   01/24/13 0230  piperacillin-tazobactam (ZOSYN) IVPB 3.375 g     3.375 g 100 mL/hr over 30 Minutes Intravenous  Once 01/24/13 0221 01/24/13 0306       Assessment/Plan  1. POD 0, s/p lap appy for perforated appendicitis  Plan: 1. Zosyn D1 today, cont for now, will need a total of 7 days abx therapy. 2. Advance diet 3. Mobilize and pulm toilet 4. If doing well, hopefully dc tomorrow.  Will need follow up in office next week for JP drain removal.   LOS: 1 day    Jamarrion Budai E 01/24/2013, 7:53 AM Pager: [phone]

## 2013-01-24 NOTE — Progress Notes (Signed)
Looks well hopefully home Saturday.

## 2013-01-24 NOTE — Discharge Instructions (Signed)
[facility] ______[facility] °LAPAROSCOPIC SURGERY: POST OP INSTRUCTIONS °Always review your discharge instruction sheet given to you by the facility where your surgery was performed. °IF YOU HAVE DISABILITY OR FAMILY LEAVE FORMS, YOU MUST BRING THEM TO THE OFFICE FOR PROCESSING.   °DO NOT GIVE THEM TO YOUR DOCTOR. ° °1. A prescription for pain medication may be given to you upon discharge.  Take your pain medication as prescribed, if needed.  If narcotic pain medicine is not needed, then you may take acetaminophen (Tylenol) or ibuprofen (Advil) as needed. °2. Take your usually prescribed medications unless otherwise directed. °3. If you need a refill on your pain medication, please contact your pharmacy.  They will contact our office to request authorization. Prescriptions will not be filled after 5pm or on week-ends. °4. You should follow a light diet the first few days after arrival home, such as soup and crackers, etc.  Be sure to include lots of fluids daily. °5. Most patients will experience some swelling and bruising in the area of the incisions.  Ice packs will help.  Swelling and bruising can take several days to resolve.  °6. It is common to experience some constipation if taking pain medication after surgery.  Increasing fluid intake and taking a stool softener (such as Colace) will usually help or prevent this problem from occurring.  A mild laxative (Milk of Magnesia or Miralax) should be taken according to package instructions if there are no bowel movements after 48 hours. °7. Unless discharge instructions indicate otherwise, you may remove your bandages 24-48 hours after surgery, and you may shower at that time.  You may have steri-strips (small skin tapes) in place directly over the incision.  These strips should be left on the skin for 7-10 days.  If your surgeon used skin glue on the incision, you may shower in 24 hours.  The glue will flake off over the next 2-3 weeks.  Any sutures or  staples will be removed at the office during your follow-up visit. °8. ACTIVITIES:  You may resume regular (light) daily activities beginning the next day--such as daily self-care, walking, climbing stairs--gradually increasing activities as tolerated.  You may have sexual intercourse when it is comfortable.  Refrain from any heavy lifting or straining until approved by your doctor. °a. You may drive when you are no longer taking prescription pain medication, you can comfortably wear a seatbelt, and you can safely maneuver your car and apply brakes. °b. RETURN TO WORK:  __________________________________________________________ °9. You should see your doctor in the office for a follow-up appointment approximately 2-3 weeks after your surgery.  Make sure that you call for this appointment within a day or two after you arrive home to insure a convenient appointment time. °10. OTHER INSTRUCTIONS: __________________________________________________________________________________________________________________________ __________________________________________________________________________________________________________________________ °WHEN TO CALL YOUR DOCTOR: °1. Fever over 101.0 °2. Inability to urinate °3. Continued bleeding from incision. °4. Increased pain, redness, or drainage from the incision. °5. Increasing abdominal pain ° °The clinic staff is available to answer your questions during regular business hours.  Please don’t hesitate to call and ask to speak to one of the nurses for clinical concerns.  If you have a medical emergency, go to the nearest emergency room or call 911.  A surgeon from [facility] is always on call at the hospital. °[address], [address], [place], [place]  [address] ? [address] [address], [place], [place]   [address] °[phone] ? [phone] ? FAX [phone] °Web site:   [email or web address] °

## 2013-01-24 NOTE — Op Note (Signed)
Appendectomy, Lap, Procedure Note  Indications: The patient presented with a history of right-sided abdominal pain. A CT revealed findings consistent with acute appendicitis.  Pre-operative Diagnosis:perforated appendicitis  Post-operative Diagnosis: Same  Surgeon: Abigail MiyamotoBLACKMAN,Ronnie Golay A   Assistants: 0  Anesthesia: General endotracheal anesthesia  ASA Class: 2  Procedure Details  The patient was seen again in the Holding Room. The risks, benefits, complications, treatment options, and expected outcomes were discussed with the patient and/or family. The possibilities of reaction to medication, perforation of viscus, bleeding, recurrent infection, finding a normal appendix, the need for additional procedures, failure to diagnose a condition, and creating a complication requiring transfusion or operation were discussed. There was concurrence with the proposed plan and informed consent was obtained. The site of surgery was properly noted. The patient was taken to Operating Room, identified as Ronnie Carr and the procedure verified as Appendectomy. A Time Out was held and the above information confirmed.  The patient was placed in the supine position and general anesthesia was induced, along with placement of orogastric tube, Venodyne boots, and a Foley catheter. The abdomen was prepped and draped in a sterile fashion. A one centimeter infraumbilical incision was made.  The umbilical stalk was elevated, and the midline fascia was incised with a #11 blade.  A Kelly clamp was used to confirm entrance into the peritoneal cavity.  A pursestring suture was passed around the incision with a 0 Vicryl.  The Hasson was introduced into the abdomen and the tails of the suture were used to hold the Hasson in place.   The pneumoperitoneum was then established to steady pressure of 15 mmHg.  Additional 5 mm cannulas then placed in the left lower quadrant of the abdomen and the suprapubic region under direct  visualization. A careful evaluation of the entire abdomen was carried out. The patient was placed in Trendelenburg and left lateral decubitus position. The small intestines were retracted in the cephalad and left lateral direction away from the pelvis and right lower quadrant. The patient was found to have an enlarged and inflamed appendix that was extending into the pelvis. There was evidence of perforation and necrosis at the tip with a moderate amount of turbid free fluid.  The appendix was carefully dissected. The appendix was was skeletonized with the harmonic scalpel.   The appendix was divided at its base using an endo-GIA stapler. Minimal appendiceal stump was left in place. There was no evidence of bleeding, leakage, or complication after division of the appendix. Irrigation was also performed and irrigate suctioned from the abdomen as well.  The umbilical port site was closed with the purse string suture. There was no residual palpable fascial defect.  The trocar site skin wounds were closed with 4-0 Monocryl.  Instrument, sponge, and needle counts were correct at the conclusion of the case.   Findings: The appendix was found to be inflamed. There were signs of necrosis.  There was perforation. There was not abscess formation.  Estimated Blood Loss:  Minimal         Drains:JACKSON-PRATT (JP)         Complications:  None; patient tolerated the procedure well.         Disposition: PACU - hemodynamically stable.         Condition: stable

## 2013-01-24 NOTE — Progress Notes (Signed)
58yo male c/o sudden onset of abdominal pain w/ SOB, found to have perforated appendicitis, now post-op where appendix was found to be inflamed w/ signs of necrosis though no abscess, to begin IV ABX.  Will start Zosyn 3.375g IV Q8H for CrCl >100 ml/min and monitor clinical progression.  Vernard GamblesVeronda Tatiyanna Lashley, PharmD, BCPS  01/24/2013 2:23 AM

## 2013-01-24 NOTE — H&P (Signed)
Ronnie Carr is an 58 y.o. male.   Chief Complaint: Right lower quadrant abdominal pain HPI: This gentleman initially presented to the emergency department with epigastric abdominal pain yesterday. This was felt to be also related as he takes a moderate amount of ibuprofen weekly. Since then, his pain has acutely worsened and is now in the right lower quadrant. The pain is sharp and quite severe and constant. He denies fevers. He is otherwise without complaints.  Past Medical History  Diagnosis Date  . Thyroid disease     History reviewed. No pertinent past surgical history.  History reviewed. No pertinent family history. Social History:  reports that he has never smoked. He does not have any smokeless tobacco history on file. He reports that he drinks alcohol. His drug history is not on file.  Allergies: No Known Allergies   (Not in a hospital admission)  Results for orders placed during the hospital encounter of 01/23/13 (from the past 48 hour(s))  CBC WITH DIFFERENTIAL     Status: Abnormal   Collection Time    01/23/13  4:48 PM      Result Value Range   WBC 13.8 (*) 4.0 - 10.5 K/uL   RBC 4.89  4.22 - 5.81 MIL/uL   Hemoglobin 16.3  13.0 - 17.0 g/dL   HCT 83.0  32.1 - 93.9 %   MCV 93.3  78.0 - 100.0 fL   MCH 33.3  26.0 - 34.0 pg   MCHC 35.7  30.0 - 36.0 g/dL   RDW 36.7  22.8 - 02.1 %   Platelets 189  150 - 400 K/uL   Neutrophils Relative % 78 (*) 43 - 77 %   Neutro Abs 10.7 (*) 1.7 - 7.7 K/uL   Lymphocytes Relative 11 (*) 12 - 46 %   Lymphs Abs 1.6  0.7 - 4.0 K/uL   Monocytes Relative 10  3 - 12 %   Monocytes Absolute 1.4 (*) 0.1 - 1.0 K/uL   Eosinophils Relative 0  0 - 5 %   Eosinophils Absolute 0.1  0.0 - 0.7 K/uL   Basophils Relative 0  0 - 1 %   Basophils Absolute 0.0  0.0 - 0.1 K/uL  COMPREHENSIVE METABOLIC PANEL     Status: Abnormal   Collection Time    01/23/13  4:48 PM      Result Value Range   Sodium 139  137 - 147 mEq/L   Potassium 4.1  3.7 - 5.3 mEq/L    Chloride 99  96 - 112 mEq/L   CO2 24  19 - 32 mEq/L   Glucose, Bld 106 (*) 70 - 99 mg/dL   BUN 12  6 - 23 mg/dL   Creatinine, Ser 8.97  0.50 - 1.35 mg/dL   Calcium 9.5  8.4 - 33.6 mg/dL   Total Protein 7.4  6.0 - 8.3 g/dL   Albumin 4.3  3.5 - 5.2 g/dL   AST 30  0 - 37 U/L   ALT 33  0 - 53 U/L   Alkaline Phosphatase 50  39 - 117 U/L   Total Bilirubin 2.0 (*) 0.3 - 1.2 mg/dL   GFR calc non Af Amer >90  >90 mL/min   GFR calc Af Amer >90  >90 mL/min   Comment: (NOTE)     The eGFR has been calculated using the CKD EPI equation.     This calculation has not been validated in all clinical situations.     eGFR's persistently <90 mL/min  signify possible Chronic Kidney     Disease.  LIPASE, BLOOD     Status: None   Collection Time    01/23/13  4:48 PM      Result Value Range   Lipase 17  11 - 59 U/L  URINALYSIS, ROUTINE W REFLEX MICROSCOPIC     Status: Abnormal   Collection Time    01/23/13  5:05 PM      Result Value Range   Color, Urine YELLOW  YELLOW   APPearance CLEAR  CLEAR   Specific Gravity, Urine 1.017  1.005 - 1.030   pH 5.5  5.0 - 8.0   Glucose, UA NEGATIVE  NEGATIVE mg/dL   Hgb urine dipstick NEGATIVE  NEGATIVE   Bilirubin Urine NEGATIVE  NEGATIVE   Ketones, ur 40 (*) NEGATIVE mg/dL   Protein, ur NEGATIVE  NEGATIVE mg/dL   Urobilinogen, UA 0.2  0.0 - 1.0 mg/dL   Nitrite NEGATIVE  NEGATIVE   Leukocytes, UA NEGATIVE  NEGATIVE   Comment: MICROSCOPIC NOT DONE ON URINES WITH NEGATIVE PROTEIN, BLOOD, LEUKOCYTES, NITRITE, OR GLUCOSE <1000 mg/dL.   Ct Abdomen Pelvis W Contrast  01/23/2013   CLINICAL DATA:  Right lower quadrant pain, nausea vomiting  EXAM: CT ABDOMEN AND PELVIS WITH CONTRAST  TECHNIQUE: Multidetector CT imaging of the abdomen and pelvis was performed using the standard protocol following bolus administration of intravenous contrast.  CONTRAST:  129mL OMNIPAQUE IOHEXOL 300 MG/ML  SOLN  COMPARISON:  None available  FINDINGS: Bibasilar atelectasis is present within the  visualized lung bases.  The liver demonstrates a normal contrast enhanced appearance. Gallbladder is within normal limits. The spleen, adrenal glands, and pancreas demonstrate a normal contrast enhanced appearance.  The kidneys are equal in size with symmetric enhancement. Mild fullness of the renal collecting systems is noted bilaterally without evidence of hydronephrosis. No nephrolithiasis or focal enhancing renal mass.  No evidence of bowel obstruction. The appendix is dilated up to 1.2 cm with extensive inflammatory fat stranding seen within the periappendiceal fat. Findings are consistent with acute appendicitis. A few small locular sub gas are seen within this region, which may overall lie in and extra luminal location, suggestive of perforation (series 2, image 62). No loculated fluid collection identified. Small amount of free fluid seen within the right pericolic gutter.  No other inflammatory changes seen within the bowels.  Bladder and prostate are unremarkable.  Normal intravascular enhancement seen within the abdomen and pelvis.  No pathologically enlarged intra-abdominal or pelvic lymph nodes identified.  No focal lytic or blastic osseous lesions.  IMPRESSION: Findings consistent with acute appendicitis. Several small locules of gas adjacent to the dilated appendix are likely extraluminal in location, suggestive of perforation. No loculated fluid collection identified.  Critical Value/emergent results were called by telephone at the time of interpretation on 01/23/2013 at 11:48 PM to Dr. Murlean Caller , who verbally acknowledged these results.   Electronically Signed   By: Jeannine Boga M.D.   On: 01/23/2013 23:49   Dg Abd Acute W/chest  01/22/2013   CLINICAL DATA:  Epigastric abdominal pain earlier today. No associated nausea, vomiting, diarrhea, or constipation. Current history of unspecified thyroid disease.  EXAM: ACUTE ABDOMEN SERIES (ABDOMEN 2 VIEW & CHEST 1 VIEW)  COMPARISON:  None.   FINDINGS: Bowel gas pattern unremarkable without evidence of obstruction or significant ileus. No evidence of free air or significant air-fluid levels on the erect image. Expected stool burden in the colon. Phleboliths in both sides of the pelvis. No  visible opaque urinary tract calculi. Mild degenerative changes involving the lumbar spine.  Cardiac silhouette upper normal in size. Hilar and mediastinal contours otherwise unremarkable. Linear atelectasis or scarring in the lung bases. Lungs otherwise clear. No localized airspace consolidation. No pleural effusions. No pneumothorax. Normal pulmonary vascularity.  IMPRESSION: 1. No acute abdominal abnormality. 2. Linear atelectasis or scar in the lung bases. No acute cardiopulmonary disease otherwise.   Electronically Signed   By: Evangeline Dakin M.D.   On: 01/22/2013 21:26    Review of Systems  Constitutional: Negative for fever and chills.  HENT: Negative.   Eyes: Negative.   Respiratory: Negative.   Cardiovascular: Negative.   Gastrointestinal: Positive for nausea, vomiting and abdominal pain.  Musculoskeletal: Negative.   Skin: Negative.   Neurological: Negative.   Endo/Heme/Allergies: Negative.   Psychiatric/Behavioral: Negative.     Blood pressure 124/69, pulse 78, temperature 99.4 F (37.4 C), temperature source Oral, resp. rate 16, height 5' 9.5" (1.765 m), weight 200 lb (90.719 kg), SpO2 99.00%. Physical Exam  Constitutional: He is oriented to person, place, and time. He appears well-developed and well-nourished. He appears distressed.  HENT:  Head: Normocephalic and atraumatic.  Right Ear: External ear normal.  Left Ear: External ear normal.  Nose: Nose normal.  Mouth/Throat: Oropharynx is clear and moist. No oropharyngeal exudate.  Eyes: Conjunctivae are normal. Pupils are equal, round, and reactive to light. Right eye exhibits no discharge. Left eye exhibits no discharge. No scleral icterus.  Neck: Normal range of motion. Neck  supple. No tracheal deviation present.  Cardiovascular: Normal rate, regular rhythm, normal heart sounds and intact distal pulses.   No murmur heard. Respiratory: Effort normal and breath sounds normal. No respiratory distress. He has no wheezes.  GI: Soft. There is tenderness. There is guarding.  Moderate to severe tenderness with guarding in the right lower quadrant  Musculoskeletal: Normal range of motion. He exhibits no edema and no tenderness.  Lymphadenopathy:    He has no cervical adenopathy.  Neurological: He is alert and oriented to person, place, and time.  Skin: Skin is warm. No rash noted. He is diaphoretic. No erythema.  Psychiatric: His behavior is normal. Judgment normal.     Assessment/Plan Acute appendicitis with possible perforation  I discussed the diagnosis with the patient and his family in detail. As he is significantly tender and in pain as well as with the CAT scan showing possible perforation of the appendix, appendectomy is recommended. I will attempt this laparoscopically. I discussed the risk of surgery with the patient and his family. These risks include but are not limited to bleeding, infection, appendiceal stump leak, injury to surrounding structures, need to convert to an open procedure, possible drain placement, possible ongoing infection, cardiopulmonary issues, DVT, etc. They understand and wish to proceed. Surgery is scheduled urgently.  Annali Lybrand A 01/24/2013, 12:14 AM

## 2013-01-24 NOTE — Anesthesia Preprocedure Evaluation (Signed)
Anesthesia Evaluation  Patient identified by MRN, date of birth, ID band Patient awake    Reviewed: Allergy & Precautions, H&P , NPO status , Patient's Chart, lab work & pertinent test results, reviewed documented beta blocker date and time   History of Anesthesia Complications Negative for: history of anesthetic complications  Airway Mallampati: I  Neck ROM: full    Dental  (+) Teeth Intact   Pulmonary neg pulmonary ROS,          Cardiovascular negative cardio ROS      Neuro/Psych negative neurological ROS  negative psych ROS   GI/Hepatic Neg liver ROS, Acute appendicities   Endo/Other  negative endocrine ROS  Renal/GU negative Renal ROS  negative genitourinary   Musculoskeletal   Abdominal   Peds  Hematology   Anesthesia Other Findings   Reproductive/Obstetrics                           Anesthesia Physical Anesthesia Plan  ASA: II and emergent  Anesthesia Plan: General ETT   Post-op Pain Management:    Induction: Intravenous, Rapid sequence and Cricoid pressure planned  Airway Management Planned: Oral ETT  Additional Equipment:   Intra-op Plan:   Post-operative Plan: Extubation in OR  Informed Consent: I have reviewed the patients History and Physical, chart, labs and discussed the procedure including the risks, benefits and alternatives for the proposed anesthesia with the patient or authorized representative who has indicated his/her understanding and acceptance.   Dental Advisory Given  Plan Discussed with: Anesthesiologist and Surgeon  Anesthesia Plan Comments:         Anesthesia Quick Evaluation

## 2013-01-24 NOTE — Transfer of Care (Signed)
Immediate Anesthesia Transfer of Care Note  Patient: Ronnie Carr  Procedure(s) Performed: Procedure(s): APPENDECTOMY LAPAROSCOPIC (N/A)  Patient Location: PACU  Anesthesia Type:General  Level of Consciousness: sedated, patient cooperative and responds to stimulation  Airway & Oxygen Therapy: Patient Spontanous Breathing and Patient connected to nasal cannula oxygen  Post-op Assessment: Report given to PACU RN, Post -op Vital signs reviewed and stable and Patient moving all extremities X 4  Post vital signs: Reviewed and stable  Complications: No apparent anesthesia complications

## 2013-01-24 NOTE — Anesthesia Postprocedure Evaluation (Signed)
Anesthesia Post Note  Patient: Ronnie Carr  Procedure(s) Performed: Procedure(s) (LRB): APPENDECTOMY LAPAROSCOPIC (N/A)  Anesthesia type: general  Patient location: PACU  Post pain: Pain level controlled  Post assessment: Patient's Cardiovascular Status Stable  Last Vitals:  Filed Vitals:   01/24/13 0500  BP: 118/65  Pulse: 78  Temp: 36.7 C  Resp: 20    Post vital signs: Reviewed and stable  Level of consciousness: sedated  Complications: No apparent anesthesia complications

## 2013-01-25 MED ORDER — AMOXICILLIN-POT CLAVULANATE 875-125 MG PO TABS: 1.0000 | ORAL_TABLET | Freq: Two times a day (BID) | ORAL | Status: DC

## 2013-01-25 MED ORDER — DOCUSATE SODIUM 100 MG PO CAPS
100.0000 mg | ORAL_CAPSULE | Freq: Two times a day (BID) | ORAL | Status: DC
  Administered 2013-01-25: 100 mg via ORAL

## 2013-01-25 MED ORDER — OXYCODONE-ACETAMINOPHEN 5-325 MG PO TABS: 1.0000 | ORAL_TABLET | ORAL | Status: DC | PRN

## 2013-01-25 NOTE — Progress Notes (Signed)
Patient requested Medical Records to be released to him for travel purposes. Gave form to be filled out, and provided patient with number for medical record office to call on Monday. Will fax form to Medical Records. Discharge instructions given to and reviewed with patient. Patient demonstrated teachback for dressing/drain care. No questions or concerns. IV removed. Patient hemodynamically stable. Patient discharged via wheelchair escorted by NT with discharge packet, personal belongings, and prescriptions in hand.

## 2013-01-25 NOTE — Progress Notes (Signed)
1 Day Post-Op  Subjective: He is feeling ok and wants to go home today  Objective: Vital signs in last 24 hours: Temp:  [98.3 F (36.8 C)-98.6 F (37 C)] 98.3 F (36.8 C) (01/17 0549) Pulse Rate:  [64-75] 64 (01/17 0900) Resp:  [14-18] 14 (01/17 0900) BP: (102-105)/(59-64) 102/59 mmHg (01/17 0549) SpO2:  [94 %-97 %] 95 % (01/17 0549) Last BM Date: 01/22/13  Intake/Output from previous day: 01/16 0701 - 01/17 0700 In: 4849.6 [P.O.:1800; I.V.:2964.6; IV Piggyback:50] Out: 80 [Drains:80] Intake/Output this shift: Total I/O In: 320 [P.O.:320] Out: -   Resp: clear to auscultation bilaterally Cardio: regular rate and rhythm GI: soft. mild tenderness. good bs. drain output serosang  Lab Results:   Recent Labs  01/23/13 1648 01/24/13 1150  WBC 13.8* 13.1*  HGB 16.3 14.1  HCT 45.6 40.0  PLT 189 151   BMET  Recent Labs  01/22/13 2045 01/23/13 1648  NA 139 139  K 3.9 4.1  CL 101 99  CO2 24 24  GLUCOSE 117* 106*  BUN 19 12  CREATININE 0.90 0.83  CALCIUM 9.1 9.5   PT/INR No results found for this basename: LABPROT, INR,  in the last 72 hours ABG No results found for this basename: PHART, PCO2, PO2, HCO3,  in the last 72 hours  Studies/Results: Dg Chest 2 View  01/24/2013   CLINICAL DATA:  Preoperative respiratory evaluation prior to appendectomy.  EXAM: CHEST  2 VIEW  COMPARISON:  One view chest x-ray 01/22/2013.  FINDINGS: Cardiomediastinal silhouette unremarkable. Linear atelectasis in the right middle lobe and lingula. Suboptimal inspiration with atelectasis in the lower lobes. No confluent airspace consolidation. No pleural effusions. Normal pulmonary vascularity. Visualized bony thorax intact.  IMPRESSION: Suboptimal inspiration with atelectasis in the lower lobes, right middle lobe, and lingula. No acute cardiopulmonary disease otherwise.   Electronically Signed   By: Hulan Saas M.D.   On: 01/24/2013 01:32   Ct Abdomen Pelvis W Contrast  01/23/2013    CLINICAL DATA:  Right lower quadrant pain, nausea vomiting  EXAM: CT ABDOMEN AND PELVIS WITH CONTRAST  TECHNIQUE: Multidetector CT imaging of the abdomen and pelvis was performed using the standard protocol following bolus administration of intravenous contrast.  CONTRAST:  OMNIPAQUE IOHEXOL 300 MG/ML  SOLN  COMPARISON:  None available  FINDINGS: Bibasilar atelectasis is present within the visualized lung bases.  The liver demonstrates a normal contrast enhanced appearance. Gallbladder is within normal limits. The spleen, adrenal glands, and pancreas demonstrate a normal contrast enhanced appearance.  The kidneys are equal in size with symmetric enhancement. Mild fullness of the renal collecting systems is noted bilaterally without evidence of hydronephrosis. No nephrolithiasis or focal enhancing renal mass.  No evidence of bowel obstruction. The appendix is dilated up to 1.2 cm with extensive inflammatory fat stranding seen within the periappendiceal fat. Findings are consistent with acute appendicitis. A few small locular sub gas are seen within this region, which may overall lie in and extra luminal location, suggestive of perforation (series 2, image 62). No loculated fluid collection identified. Small amount of free fluid seen within the right pericolic gutter.  No other inflammatory changes seen within the bowels.  Bladder and prostate are unremarkable.  Normal intravascular enhancement seen within the abdomen and pelvis.  No pathologically enlarged intra-abdominal or pelvic lymph nodes identified.  No focal lytic or blastic osseous lesions.  IMPRESSION: Findings consistent with acute appendicitis. Several small locules of gas adjacent to the dilated appendix are likely extraluminal  in location, suggestive of perforation. No loculated fluid collection identified.  Critical Value/emergent results were called by telephone at the time of interpretation on 01/23/2013 at 11:48 PM to Dr. Marena ChancyWILLIAM WALDEN , who  verbally acknowledged these results.   Electronically Signed   By: Rise MuBenjamin  McClintock M.D.   On: 01/23/2013 23:49    Anti-infectives: Anti-infectives   Start     Dose/Rate Route Frequency Ordered Stop   01/24/13 0800  piperacillin-tazobactam (ZOSYN) IVPB 3.375 g     3.375 g 12.5 mL/hr over 240 Minutes Intravenous Every 8 hours 01/24/13 0221     01/24/13 0600  ertapenem (INVANZ) 1 g in sodium chloride 0.9 % 50 mL IVPB     1 g 100 mL/hr over 30 Minutes Intravenous On call to O.R. 01/24/13 0012 01/24/13 0057   01/24/13 0230  piperacillin-tazobactam (ZOSYN) IVPB 3.375 g     3.375 g 100 mL/hr over 30 Minutes Intravenous  Once 01/24/13 0221 01/24/13 0306      Assessment/Plan: s/p Procedure(s): APPENDECTOMY LAPAROSCOPIC (N/A) Ok for discharge Will send home on oral abx with drain  LOS: 2 days    TOTH III,PAUL S 01/25/2013

## 2013-01-25 NOTE — Discharge Summary (Signed)
Physician Discharge Summary  Patient ID: Ronnie Carr MRN: [id number] DOB/AGE: [birth year] 58 y.o.  Admit date: 01/23/2013 Discharge date: 01/25/2013  Admission Diagnoses:  Discharge Diagnoses:  Active Problems:   Perforated appendicitis   Discharged Condition: good  Hospital Course: the pt underwent lap appy. He tolerated the surgery well. On pod 1 he was ready for discharge home  Consults: None  Significant Diagnostic Studies: none  Treatments: surgery: as above  Discharge Exam: Blood pressure 102/59, pulse 64, temperature 98.3 F (36.8 C), temperature source Oral, resp. rate 14, height 5\' 9"  (1.753 m), weight 200 lb (90.719 kg), SpO2 95.00%. GI: soft, minimal tenderness. good bs  Disposition: 01-Home or Self Care  Discharge Orders   Future Appointments Provider Department Dept Phone   01/30/2013 9:30 AM [facility] [place] [phone]   02/18/2013 2:45 PM [facility] [place] [phone]   Future Orders Complete By Expires   Call MD for:  difficulty breathing, headache or visual disturbances  As directed    Call MD for:  extreme fatigue  As directed    Call MD for:  hives  As directed    Call MD for:  persistant dizziness or light-headedness  As directed    Call MD for:  persistant nausea and vomiting  As directed    Call MD for:  redness, tenderness, or signs of infection (pain, swelling, redness, odor or green/yellow discharge around incision site)  As directed    Call MD for:  severe uncontrolled pain  As directed    Call MD for:  temperature >100.4  As directed    Diet - low sodium heart healthy  As directed    Discharge instructions  As directed    Comments:     Sponge bathe while drain is in. Twice a day you should empty drain and record output then recharge the drain   Increase activity slowly  As directed    No wound care  As directed        Medication List         amoxicillin-clavulanate  875-125 MG per tablet  Commonly known as:  AUGMENTIN  Take 1 tablet by mouth 2 (two) times daily.     HYDROcodone-acetaminophen 5-325 MG per tablet  Commonly known as:  NORCO/VICODIN  Take 1 tablet by mouth every 6 (six) hours as needed.     ibuprofen 200 MG tablet  Commonly known as:  ADVIL,MOTRIN  Take 600 mg by mouth every 6 (six) hours as needed for moderate pain.     levothyroxine 88 MCG tablet  Commonly known as:  SYNTHROID, LEVOTHROID  Take 88 mcg by mouth daily before breakfast.     oxyCODONE-acetaminophen 5-325 MG per tablet  Commonly known as:  PERCOCET/ROXICET  Take 1 tablet by mouth once.     oxyCODONE-acetaminophen 5-325 MG per tablet  Commonly known as:  ROXICET  Take 1-2 tablets by mouth every 4 (four) hours as needed for severe pain.     pantoprazole 20 MG tablet  Commonly known as:  PROTONIX  Take 1 tablet (20 mg total) by mouth daily.     SLEEP AID PO  Take 1 tablet by mouth at bedtime as needed (for sleep).           Follow-up Information   Follow up with [facility] On 01/30/2013. (9:30am, for drain removal, you will see one of the nurses)    Contact information:  [address] [place] [address] [phone]      Follow up with [facility] On 02/18/2013. (2:45pm, arrive at 2:15pm for paperwork)    Contact information:   [address]   [place] [address] [phone]       Follow up with Carr,Ronnie A., MD In 1 week. (for drain removal)    Specialty:  General Surgery   Contact information:   [address] [place] [address] [phone]       Signed: Robyne Askew 01/25/2013, 11:08 AM

## 2013-01-28 ENCOUNTER — Encounter (HOSPITAL_COMMUNITY): Payer: Self-pay | Admitting: Surgery

## 2013-01-30 ENCOUNTER — Encounter (INDEPENDENT_AMBULATORY_CARE_PROVIDER_SITE_OTHER): Payer: Self-pay

## 2013-01-30 ENCOUNTER — Ambulatory Visit (HOSPITAL_COMMUNITY)
Admission: RE | Admit: 2013-01-30 | Discharge: 2013-01-30 | Disposition: A | Discharge status: Home / Self Care | Payer: BC Managed Care – PPO | Source: Ambulatory Visit | Attending: General Surgery | Admitting: General Surgery

## 2013-01-30 ENCOUNTER — Ambulatory Visit (INDEPENDENT_AMBULATORY_CARE_PROVIDER_SITE_OTHER): Payer: BC Managed Care – PPO | Admitting: General Surgery

## 2013-01-30 ENCOUNTER — Encounter (INDEPENDENT_AMBULATORY_CARE_PROVIDER_SITE_OTHER): Payer: Self-pay | Admitting: General Surgery

## 2013-01-30 VITALS — BP 130/84 | HR 70 | Temp 97.0°F | Resp 20 | Wt 202.0 lb

## 2013-01-30 VITALS — BP 102/68 | HR 66 | Temp 98.5°F | Resp 16

## 2013-01-30 DIAGNOSIS — K352 Acute appendicitis with generalized peritonitis, without abscess: Secondary | ICD-10-CM

## 2013-01-30 DIAGNOSIS — I708 Atherosclerosis of other arteries: Secondary | ICD-10-CM | POA: Insufficient documentation

## 2013-01-30 DIAGNOSIS — K6389 Other specified diseases of intestine: Secondary | ICD-10-CM | POA: Insufficient documentation

## 2013-01-30 DIAGNOSIS — K3532 Acute appendicitis with perforation and localized peritonitis, without abscess: Secondary | ICD-10-CM

## 2013-01-30 DIAGNOSIS — M47817 Spondylosis without myelopathy or radiculopathy, lumbosacral region: Secondary | ICD-10-CM | POA: Insufficient documentation

## 2013-01-30 DIAGNOSIS — Z9089 Acquired absence of other organs: Secondary | ICD-10-CM | POA: Insufficient documentation

## 2013-01-30 DIAGNOSIS — M47814 Spondylosis without myelopathy or radiculopathy, thoracic region: Secondary | ICD-10-CM | POA: Insufficient documentation

## 2013-01-30 DIAGNOSIS — R1032 Left lower quadrant pain: Secondary | ICD-10-CM

## 2013-01-30 DIAGNOSIS — K5000 Crohn's disease of small intestine without complications: Secondary | ICD-10-CM | POA: Insufficient documentation

## 2013-01-30 DIAGNOSIS — M949 Disorder of cartilage, unspecified: Secondary | ICD-10-CM

## 2013-01-30 DIAGNOSIS — M899 Disorder of bone, unspecified: Secondary | ICD-10-CM | POA: Insufficient documentation

## 2013-01-30 LAB — CBC WITH DIFFERENTIAL/PLATELET
HCT: 44.5 % (ref 39.0–52.0)
HEMOGLOBIN: 14.9 g/dL (ref 13.0–17.0)
LYMPHS ABS: 1.5 10*3/uL (ref 0.7–4.0)
LYMPHS PCT: 16 % (ref 12–46)
MCH: 32.3 pg (ref 26.0–34.0)
MCHC: 33.6 g/dL (ref 30.0–36.0)
MCV: 96.4 fL (ref 78.0–100.0)
Monocytes Absolute: 0.6 10*3/uL (ref 0.1–1.0)
Monocytes Relative: 6 % (ref 3–12)
NEUTROS ABS: 7.5 10*3/uL (ref 1.7–7.7)
NEUTROS PCT: 78 % — AB (ref 43–77)
PLATELETS: 257 10*3/uL (ref 150–400)
RBC: 4.61 MIL/uL (ref 4.22–5.81)
RDW: 12.7 % (ref 11.5–15.5)
WBC: 9.6 10*3/uL (ref 4.0–10.5)

## 2013-01-30 MED ORDER — OXYCODONE-ACETAMINOPHEN 5-325 MG PO TABS: 1.0000 | ORAL_TABLET | ORAL | Status: DC | PRN

## 2013-01-30 MED ORDER — IOHEXOL 300 MG/ML  SOLN
100.0000 mL | Freq: Once | INTRAMUSCULAR | Status: AC | PRN
  Administered 2013-01-30: 100 mL via INTRAVENOUS

## 2013-01-30 MED ORDER — DOCUSATE SODIUM 100 MG PO CAPS: 100.0000 mg | ORAL_CAPSULE | Freq: Two times a day (BID) | ORAL | Status: AC

## 2013-01-30 NOTE — Progress Notes (Signed)
Subjective:     Patient ID: Ronnie Carr, male   DOB: 02-11-1955, 58 y.o.   MRN: [id number]  HPI The patient is a 58 year old male status post laparoscopic cholecystectomy by Dr. Magnus IvanBlackman on 01/24/2013. The patient had a perforated appendix and a drain was placed intraoperatively.  The patient presents today for nurse visit for evaluation of his drain and possible removal. The patient states he's had some pain in his right lower quadrant. He also has some edema to his right lateral abdominal wall near his drain site. The patient does state he has had no fevers while at home. He has not had any diarrhea but is constipated.  Patient's JP drain is her sinuses that could not 10 cc per day.  Review of Systems  Constitutional: Negative.   HENT: Negative.   Eyes: Negative.   Respiratory: Negative.   Cardiovascular: Negative.   Gastrointestinal: Negative.   Endocrine: Negative.   Neurological: Negative.        Objective:   Physical Exam  Constitutional: He is oriented to person, place, and time. He appears well-developed and well-nourished.  HENT:  Head: Normocephalic and atraumatic.  Eyes: Conjunctivae and EOM are normal. Pupils are equal, round, and reactive to light.  Neck: Normal range of motion. Neck supple.  Cardiovascular: Normal rate, regular rhythm and normal heart sounds.   Pulmonary/Chest: Effort normal and breath sounds normal.  Abdominal: Soft. Bowel sounds are normal. He exhibits mass. He exhibits no distension. There is tenderness (Right lower quadrant). There is guarding. There is no rebound (right lower quadrant).  His wounds are clean dry and intact, has some ecchymosis at his umbilicus. There is large amount of edema to his right lateral abdominal wall as compared to the left.  Musculoskeletal: Normal range of motion.  Neurological: He is alert and oriented to person, place, and time.  Skin: Skin is warm and dry.       Assessment:     58 year old male with concern for  possible intra-abdominal abscess status post laparoscopic appendectomy.     Plan:     1. We will have the patient undergo a CBC as well as CT scan with IV contrast to evaluate for possible intra-abdominal abscess. 2. We'll have patient follow up per the hurts physician today with those results and any need for further treatment.

## 2013-01-30 NOTE — Patient Instructions (Signed)
Take colace 100mg  up to 3 times a day.  If that doesn't work, you can take Miralax 1-2 doses a day.

## 2013-01-30 NOTE — Progress Notes (Signed)
Ronnie Carr is a 58 y.o. male who is status post a Laparoscopic appendectomy on January 15.  This was perforated and a drain was placed. He presented to the office earlier this morning for nurse visit and drain removal. There was some concern because of right-sided abdominal pain and some swelling noted on his right flank. A CT scan was ordered. The CT scan shows resolution of the fluid collection and some edema noted in the tissues of the right flank. He does have some dilated small bowel that could possibly be due to his inflammatory process. He is tolerating a regular diet and having bowel movements with the help of an enema. He denies any nausea or vomiting. He states his pain is getting better. He is increasing his activity. He denies any fevers.  Objective: Filed Vitals:   01/30/13 1619  BP: 102/68  Pulse: 66  Temp: 98.5 F (36.9 C)  Resp: 16    General appearance: alert and cooperative GI: normal findings: soft, non-tender right flank erythema noted. This has been in deep in that position and not associated with the drain opening  Incision: healing well   Assessment: s/p  Patient Active Problem List   Diagnosis Date Noted  . Perforated appendicitis 01/24/2013    Plan: Given his CT findings and the fact that he is getting better systemically, we decided to remove the drain. He will call the office if his right flank erythema gets worse or he develops fevers. He will let us know if he has any trouble with nausea or vomiting over the next few days.    Vanita Panda.Naina Sleeper C [place] Sigmund, MD [facility], [place] [phone]   01/30/2013 4:51 PM

## 2013-02-03 ENCOUNTER — Encounter (INDEPENDENT_AMBULATORY_CARE_PROVIDER_SITE_OTHER): Payer: Self-pay | Admitting: Surgery

## 2013-02-18 ENCOUNTER — Ambulatory Visit (INDEPENDENT_AMBULATORY_CARE_PROVIDER_SITE_OTHER): Payer: BC Managed Care – PPO | Admitting: General Surgery

## 2013-02-18 ENCOUNTER — Encounter (INDEPENDENT_AMBULATORY_CARE_PROVIDER_SITE_OTHER): Payer: Self-pay

## 2013-02-18 VITALS — BP 120/72 | HR 72 | Temp 98.6°F | Resp 14 | Ht 69.5 in | Wt 195.0 lb

## 2013-02-18 DIAGNOSIS — K3580 Unspecified acute appendicitis: Secondary | ICD-10-CM

## 2013-02-18 NOTE — Patient Instructions (Signed)
Call again if you need anything.  The end of that suture should go away in time.

## 2013-02-18 NOTE — Progress Notes (Signed)
Carole Stefanik [birth year] [id number] 02/18/2013   History of Present Illness: Ronnie Carr is a  58 y.o. male who presents today status post lap appy by Dr. Abigail Miyamotoouglas Blackman.   Pathology reveals Appendix, Other than Incidental - ACUTE AND SUPPURATIVE APPENDICITIS AND SEROSITIS WITH TRANSMURAL DEFECT. - THERE IS NO EVIDENCE OF MALIGNANCY..   The patient is tolerating a regular diet, having normal bowel movements, has good pain control.  He  is back to most normal activities.   Physical Exam: BP 120/72  Pulse 72  Temp(Src) 98.6 F (37 C) (Oral)  Resp 14  Ht 5' 9.5" (1.765 m)  Wt 88.451 kg (195 lb)  BMI 28.39 kg/m2  Abd: soft, nontender, active bowel sounds, nondistended.  All incisions are well healed. He can feel the end of the SQ suture, and I tried to get it out without any success. Impression: 1.  Acute appendicitis, s/p lap appy  Plan: He is able to return to normal activities. He may follow up on a prn basis.

## 2013-02-22 ENCOUNTER — Emergency Department (HOSPITAL_BASED_OUTPATIENT_CLINIC_OR_DEPARTMENT_OTHER)
Admission: EM | Admit: 2013-02-22 | Discharge: 2013-02-22 | Disposition: A | Discharge status: Home / Self Care | Payer: BC Managed Care – PPO | Attending: Emergency Medicine | Admitting: Emergency Medicine

## 2013-02-22 ENCOUNTER — Emergency Department (HOSPITAL_BASED_OUTPATIENT_CLINIC_OR_DEPARTMENT_OTHER): Payer: BC Managed Care – PPO

## 2013-02-22 ENCOUNTER — Encounter (HOSPITAL_BASED_OUTPATIENT_CLINIC_OR_DEPARTMENT_OTHER): Payer: Self-pay | Admitting: Emergency Medicine

## 2013-02-22 DIAGNOSIS — E079 Disorder of thyroid, unspecified: Secondary | ICD-10-CM | POA: Insufficient documentation

## 2013-02-22 DIAGNOSIS — Z87442 Personal history of urinary calculi: Secondary | ICD-10-CM | POA: Insufficient documentation

## 2013-02-22 DIAGNOSIS — R109 Unspecified abdominal pain: Secondary | ICD-10-CM | POA: Insufficient documentation

## 2013-02-22 DIAGNOSIS — Z79899 Other long term (current) drug therapy: Secondary | ICD-10-CM | POA: Insufficient documentation

## 2013-02-22 LAB — COMPREHENSIVE METABOLIC PANEL
ALK PHOS: 50 U/L (ref 39–117)
ALT: 43 U/L (ref 0–53)
AST: 38 U/L — ABNORMAL HIGH (ref 0–37)
Albumin: 3.9 g/dL (ref 3.5–5.2)
BILIRUBIN TOTAL: 1.5 mg/dL — AB (ref 0.3–1.2)
BUN: 15 mg/dL (ref 6–23)
CO2: 22 mEq/L (ref 19–32)
CREATININE: 0.8 mg/dL (ref 0.50–1.35)
Calcium: 9.1 mg/dL (ref 8.4–10.5)
Chloride: 103 mEq/L (ref 96–112)
GFR calc non Af Amer: >90 mL/min (ref >90)
Glucose, Bld: 110 mg/dL — ABNORMAL HIGH (ref 70–99)
POTASSIUM: 3.7 meq/L (ref 3.7–5.3)
Sodium: 140 mEq/L (ref 137–147)
TOTAL PROTEIN: 6.7 g/dL (ref 6.0–8.3)

## 2013-02-22 LAB — URINALYSIS, ROUTINE W REFLEX MICROSCOPIC
BILIRUBIN URINE: NEGATIVE
Glucose, UA: NEGATIVE mg/dL
HGB URINE DIPSTICK: NEGATIVE
Ketones, ur: NEGATIVE mg/dL
LEUKOCYTES UA: NEGATIVE
Nitrite: NEGATIVE
PH: 5.5 (ref 5.0–8.0)
Protein, ur: NEGATIVE mg/dL
Specific Gravity, Urine: 1.015 (ref 1.005–1.030)
Urobilinogen, UA: 0.2 mg/dL (ref 0.0–1.0)

## 2013-02-22 LAB — CBC
HCT: 41.2 % (ref 39.0–52.0)
HEMOGLOBIN: 14.4 g/dL (ref 13.0–17.0)
MCH: 31.5 pg (ref 26.0–34.0)
MCHC: 35 g/dL (ref 30.0–36.0)
MCV: 90.2 fL (ref 78.0–100.0)
Platelets: 133 10*3/uL — ABNORMAL LOW (ref 150–400)
RBC: 4.57 MIL/uL (ref 4.22–5.81)
RDW: 12.6 % (ref 11.5–15.5)
WBC: 6.3 10*3/uL (ref 4.0–10.5)

## 2013-02-22 MED ORDER — ONDANSETRON HCL 4 MG/2ML IJ SOLN
4.0000 mg | Freq: Once | INTRAMUSCULAR | Status: AC
  Administered 2013-02-22: 4 mg via INTRAVENOUS
  Filled 2013-02-22: qty 2

## 2013-02-22 MED ORDER — SODIUM CHLORIDE 0.9 % IV BOLUS (SEPSIS)
1000.0000 mL | Freq: Once | INTRAVENOUS | Status: AC
  Administered 2013-02-22: 1000 mL via INTRAVENOUS

## 2013-02-22 MED ORDER — MORPHINE SULFATE 4 MG/ML IJ SOLN
4.0000 mg | Freq: Once | INTRAMUSCULAR | Status: AC
  Administered 2013-02-22: 4 mg via INTRAVENOUS
  Filled 2013-02-22: qty 1

## 2013-02-22 NOTE — ED Provider Notes (Signed)
CSN: [id number]     Arrival date & time 02/22/13  1191 History   First MD Initiated Contact with Patient 02/22/13 0802     Chief Complaint  Patient presents with  . Flank Pain     (Consider location/radiation/quality/duration/timing/severity/associated sxs/prior Treatment) HPI Pt presenting with c/o right lower and right pelvic pain.  Pt states pain began yesterday and is worse today.  No vomiting.  Pt has had difficulty urinating today.  No fever/chills.  Pt has had recent appendectomy after ruptured appendix.  No complications and has completed antibiotics.  He does have a hx of kidney stones.  Pain is worse with palpation.  There are no other associated systemic symptoms, there are no other alleviating or modifying factors.   Past Medical History  Diagnosis Date  . Thyroid disease    Past Surgical History  Procedure Laterality Date  . Laparoscopic appendectomy N/A 01/24/2013    Procedure: APPENDECTOMY LAPAROSCOPIC;  Surgeon: Shelly Rubenstein, MD;  Location: [facility] OR;  Service: General;  Laterality: N/A;  . Appendectomy     No family history on file. History  Substance Use Topics  . Smoking status: Never Smoker   . Smokeless tobacco: Not on file  . Alcohol Use: Yes     Comment: occasionally    Review of Systems ROS reviewed and all otherwise negative except for mentioned in HPI    Allergies  Tetracyclines & related  Home Medications   Current Outpatient Rx  Name  Route  Sig  Dispense  Refill  . docusate sodium (COLACE) 100 MG capsule   Oral   Take 1 capsule (100 mg total) by mouth 2 (two) times daily.   10 capsule   0   . Doxylamine Succinate, Sleep, (SLEEP AID PO)   Oral   Take 1 tablet by mouth at bedtime as needed (for sleep).         Marland Kitchen ibuprofen (ADVIL,MOTRIN) 200 MG tablet   Oral   Take 600 mg by mouth every 6 (six) hours as needed for moderate pain.         Marland Kitchen levothyroxine (SYNTHROID, LEVOTHROID) 88 MCG tablet   Oral   Take 88 mcg by mouth daily  before breakfast.          BP 130/76  Pulse 65  Temp(Src) 97.4 F (36.3 C) (Oral)  Resp 24  Ht 5\' 9"  (1.753 m)  Wt 195 lb (88.451 kg)  BMI 28.78 kg/m2  SpO2 95% Vitals reviewed Physical Exam Physical Examination: General appearance - alert, well appearing, and in no distress Mental status - alert, oriented to person, place, and time Eyes - no conjunctival injection, no scleral icterus Mouth - mucous membranes moist, pharynx normal without lesions Chest - clear to auscultation, no wheezes, rales or rhonchi, symmetric air entry Heart - normal rate, regular rhythm, normal S1, S2, no murmurs, rubs, clicks or gallops Abdomen - soft, mild ttp in right lowermost abdomen, no gaurding no rebound, nondistended, no masses or organomegaly Extremities - peripheral pulses normal, no pedal edema, no clubbing or cyanosis Skin - normal coloration and turgor, no rashes  ED Course  Procedures (including critical care time)  10:46 AM pt became very upset upon my recheck and talking over results with him.  He was very quiet but obviously upset.  I asked if he had questions and he stated that he wanted a specific diagnosis.  I discussed all lab and CT findings with him.  Let him know there are no acute  findings on CT.  He is concerned about not being able to urinate on his own.  I d/w him that the treatment would be a foley catheter.  He would like to try to urinate on his own.  He states "you are not giving me any good options if one option is to have a catheter" I explained to him that to place a foley and have outpatient follow up with urology is standard practice for urinary retention.  He does not want catheter now.  He is asking if there is urologist who would see him today.  I have d/w him that the first step would be to place the catheter here in the ED and f/u with urology as an oupatient.  Pt then became angry stating that " I don't know why I bother coming here, last time I was here you all let my  appendix rupture".  I have given him the option of placing a foley catheter which I recommend due to his inability to urinate.  He declines at this time and states he will come back later today if he feels he cannot urinate to have catheter placed.  I made every effort to explain results, answer questions and reassure patient and wife and bedside.  Pt still appeared upset, but wife seemed more reassured.  At the end of our discussion patient is requesting to be discharged and states he has no further questions.  Despite every attempt I feel we were not able to meet this patient's expectations for his care.  Labs Review Labs Reviewed  CBC - Abnormal; Notable for the following:    Platelets 133 (*)    All other components within normal limits  COMPREHENSIVE METABOLIC PANEL - Abnormal; Notable for the following:    Glucose, Bld 110 (*)    AST 38 (*)    Total Bilirubin 1.5 (*)    All other components within normal limits  URINALYSIS, ROUTINE W REFLEX MICROSCOPIC   Imaging Review Ct Abdomen Pelvis Wo Contrast  02/22/2013   CLINICAL DATA:  The right flank pain and right lower quadrant pain. Appendectomy 3 weeks ago. Difficulty urinating.  EXAM: CT ABDOMEN AND PELVIS WITHOUT CONTRAST  TECHNIQUE: Multidetector CT imaging of the abdomen and pelvis was performed following the standard protocol without intravenous contrast.  COMPARISON:  01/30/2013 and 01/23/2013  FINDINGS: Lung bases demonstrate minimal linear atelectasis.  Abdominal images demonstrate a normal liver, spleen, pancreas, gallbladder, adrenal glands and kidneys. Ureters are within normal. There is evidence of patient's recent appendectomy. There is subtle stranding of the fat in the right lower quadrant inferior to the cecum. Terminal ileum is within normal. There is no evidence of abscess or perforation. There is no significant free fluid. There is mild diverticulosis of the colon.  Pelvic images are unremarkable. There is mild degenerative  change of the spine.  IMPRESSION: Evidence of patient's recent appendectomy. No acute findings. No evidence of abscess or perforation.  Diverticulosis of the colon.   Electronically Signed   By: Elberta Fortisaniel  Boyle M.D.   On: 02/22/2013 09:10    EKG Interpretation   None       MDM   Final diagnoses:  Abdominal pain    Pt presenting with lower right abdominal pain.  He is approx 3-4 weeks s/p appendectomy.  There are no acute findings on CT scan.  Urinalysis is reassuring, no elevation in WBCs.  Pt also stating he is having difficulty urinating. Have d/w patient that the next step  with urinary obstruction is a foley catheter- see note above.  Pt states he will return to the ED if he continues to have difficulty urinating.  Discharged with strict return precautions.  Pt agreeable with plan.    Ethelda Chick, MD 02/22/13 1255

## 2013-02-22 NOTE — Discharge Instructions (Signed)
Return to the ED with any concerns including difficulty urinating, worsening abdominal pain, fever/chills, vomiting and not able to keep down liquids, decreased level of alertness/lethargy, or any other alarming symptoms

## 2013-02-22 NOTE — ED Notes (Signed)
Patient experiencing R side pain since yesterday, unable to urinate, took two oxycodone around 7am. Has had a kidney stone in the past. Also concerned because he had emergency surgery to have his appendix removed three week sago

## 2014-06-27 IMAGING — CT CT ABD-PELV W/O CM
2 of 4 series · 16 of 46 positions shown, 18 images · non-contrast
Comparison: 01/30/2013 and 01/23/2013

CLINICAL DATA: The right flank pain and right lower quadrant pain.
Appendectomy 3 weeks ago. Difficulty urinating.

EXAM:
CT ABDOMEN AND PELVIS WITHOUT CONTRAST
TECHNIQUE: Multidetector CT imaging of the abdomen and pelvis was performed
following the standard protocol without intravenous contrast.

[Series 2: renal stone > 200 lbs 5.0 b31f · axial · 0.75mm/px · z∈[-546,-111]mm · 13 of 95 slices shown, 15 images]
[im 4/95  soft-tissue]
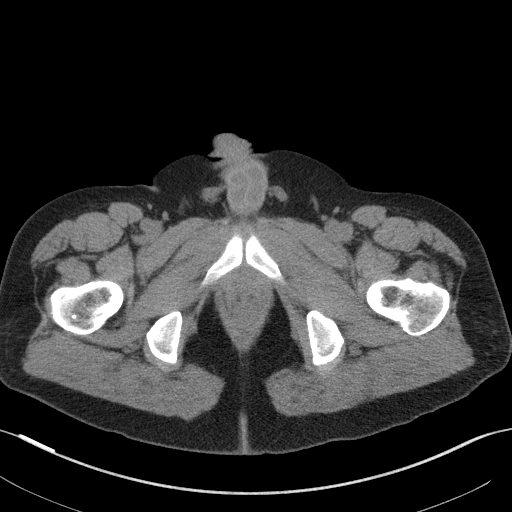
[im 4/95  bone]
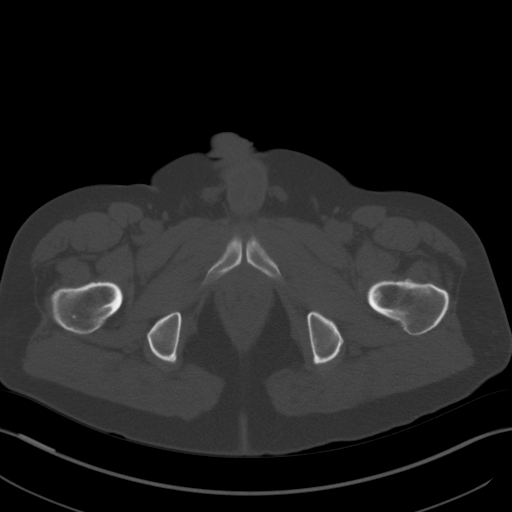
[im 12/95  soft-tissue]
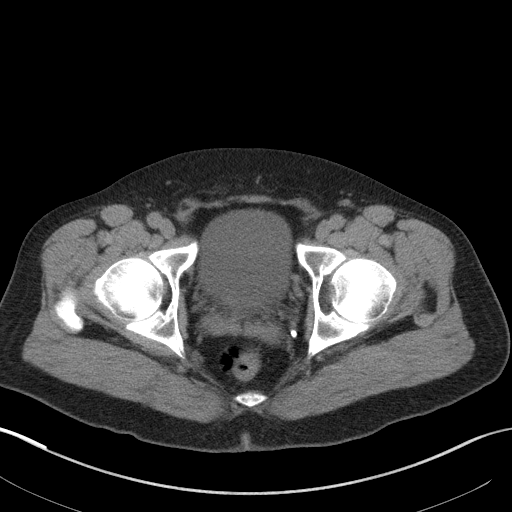
[im 19/95  soft-tissue]
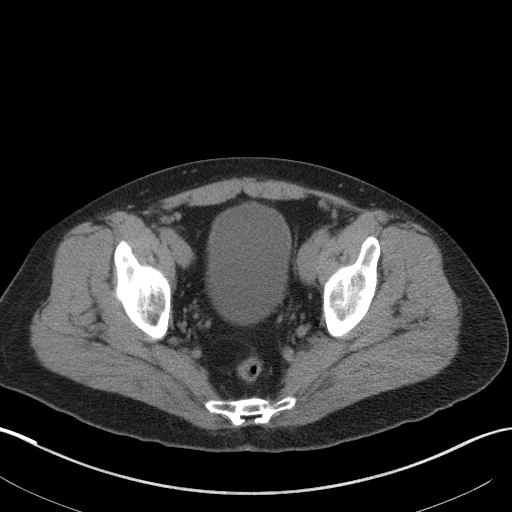
[im 27/95  soft-tissue]
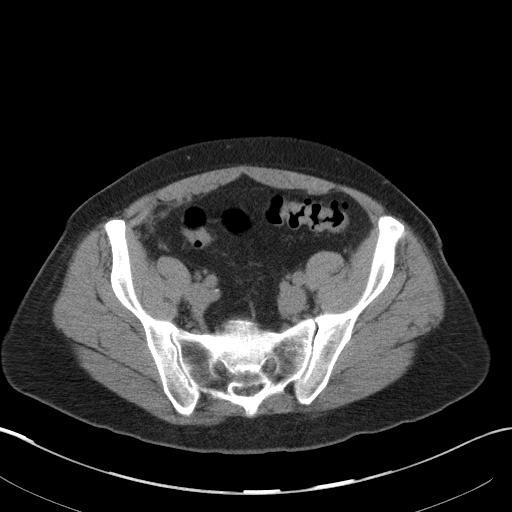
[im 34/95  soft-tissue]
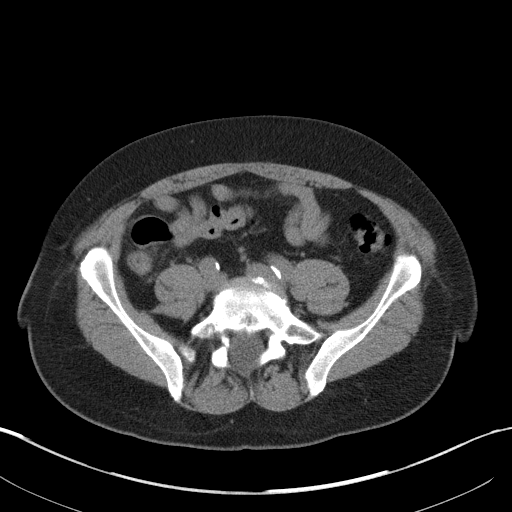
[im 42/95  soft-tissue]
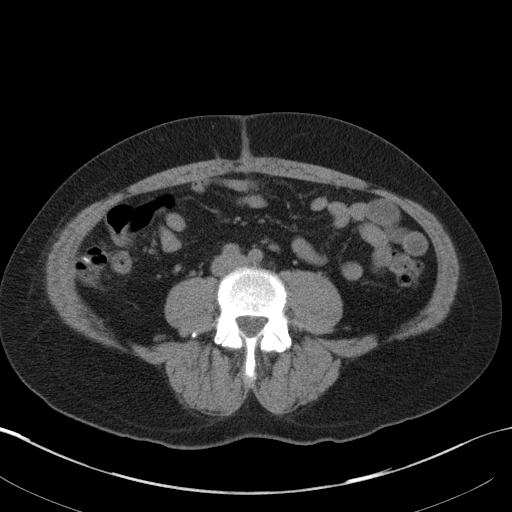
[im 49/95  soft-tissue]
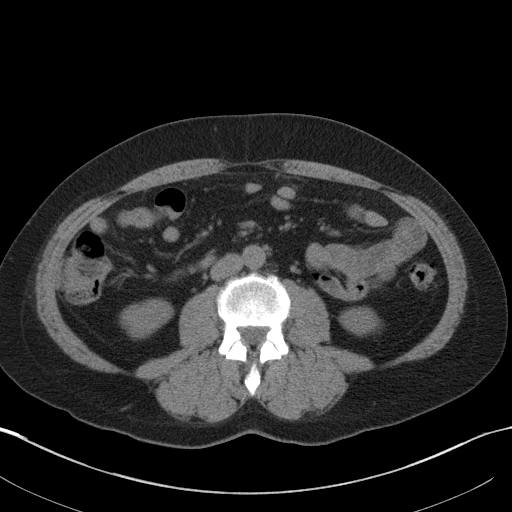
[im 53/95  soft-tissue]
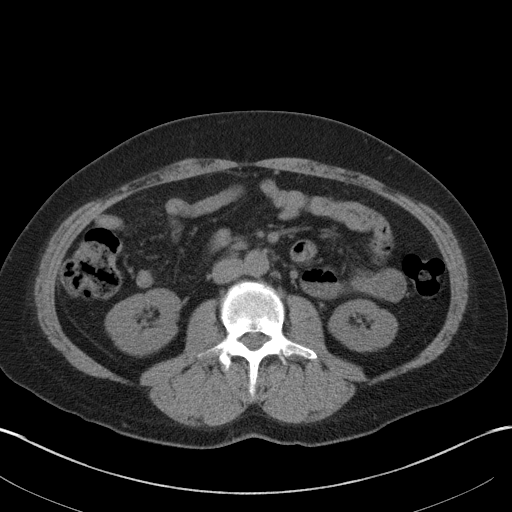
[im 61/95  soft-tissue]
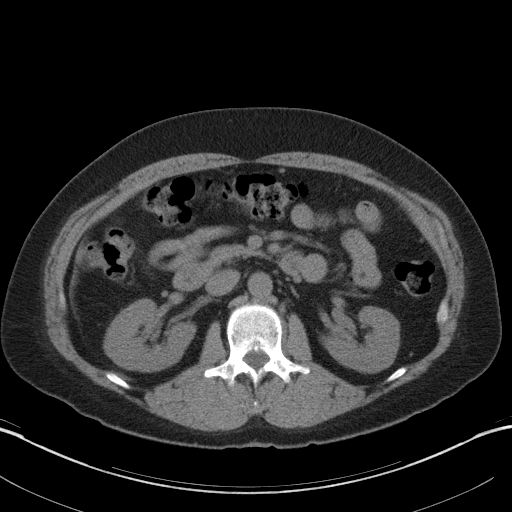
[im 61/95  bone]
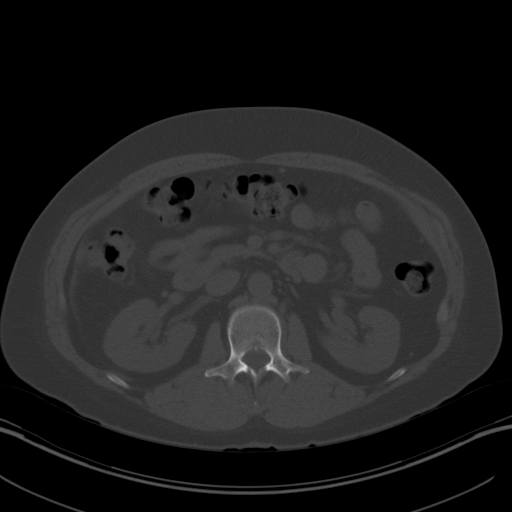
[im 68/95  soft-tissue]
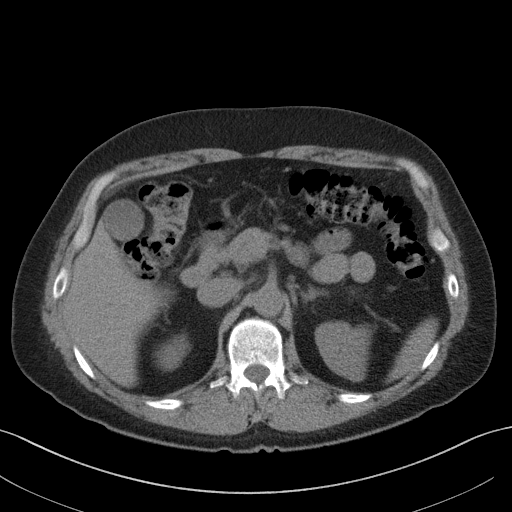
[im 76/95  soft-tissue]
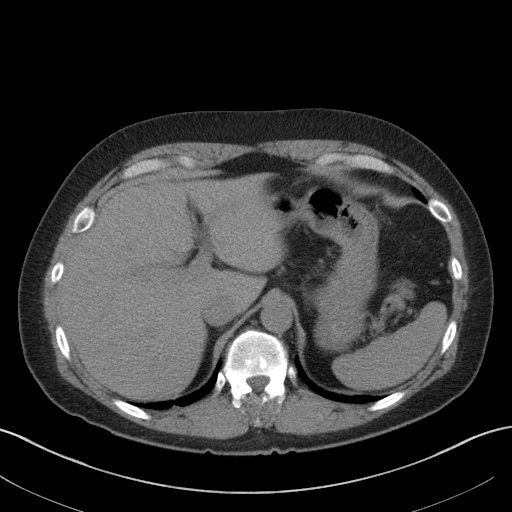
[im 83/95  soft-tissue]
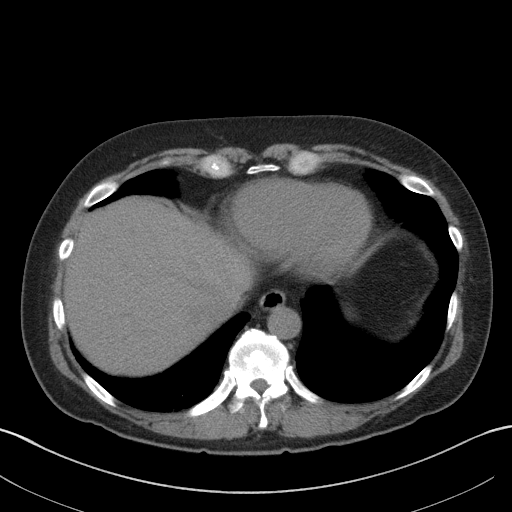
[im 91/95  soft-tissue]
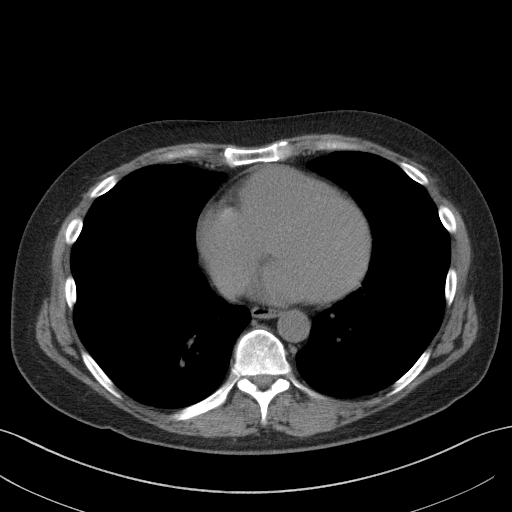

[Series 5: renal stone 3.0 coronal · coronal · 0.91mm/px · 3 of 76 slices shown]
[im 26/76  soft-tissue]
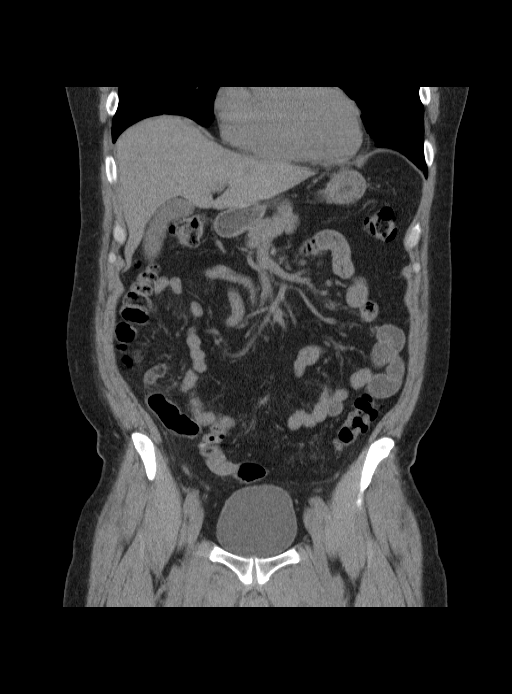
[im 34/76  soft-tissue]
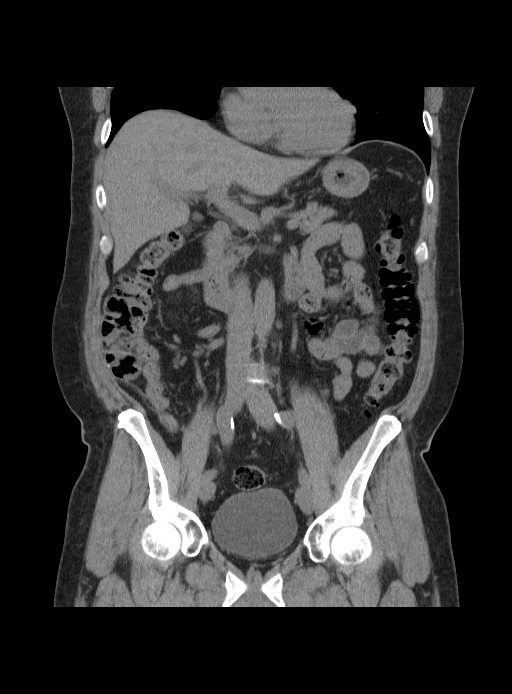
[im 42/76  soft-tissue]
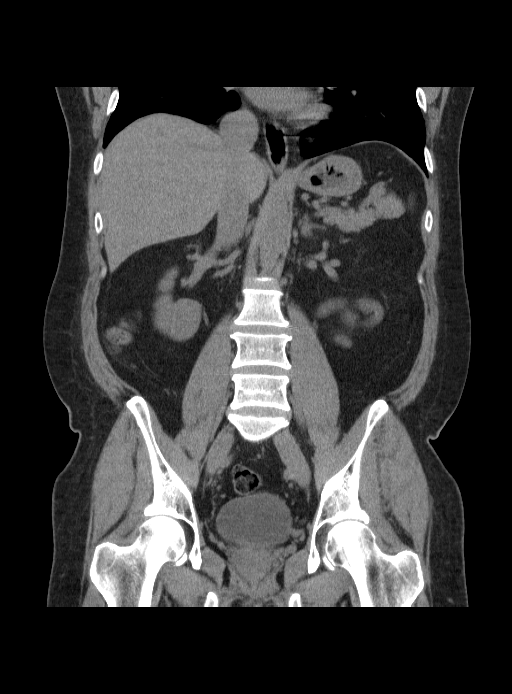

[16 of 46 positions shown; findings below may reference images not displayed]

FINDINGS: Lung bases demonstrate minimal linear atelectasis.

Abdominal images demonstrate a normal liver, spleen, pancreas,
gallbladder, adrenal glands and kidneys. Ureters are within normal.
There is evidence of patient's recent appendectomy. There is subtle
stranding of the fat in the right lower quadrant inferior to the
cecum. Terminal ileum is within normal. There is no evidence of
abscess or perforation. There is no significant free fluid. There is
mild diverticulosis of the colon.

Pelvic images are unremarkable. There is mild degenerative change of
the spine.
IMPRESSION: Evidence of patient's recent appendectomy. No acute findings. No
evidence of abscess or perforation.

Diverticulosis of the colon.

## 2019-01-16 ENCOUNTER — Emergency Department (HOSPITAL_BASED_OUTPATIENT_CLINIC_OR_DEPARTMENT_OTHER): Payer: 59

## 2019-01-16 ENCOUNTER — Emergency Department (HOSPITAL_COMMUNITY): Payer: 59

## 2019-01-16 ENCOUNTER — Encounter (HOSPITAL_BASED_OUTPATIENT_CLINIC_OR_DEPARTMENT_OTHER): Payer: Self-pay | Admitting: Emergency Medicine

## 2019-01-16 ENCOUNTER — Emergency Department (HOSPITAL_BASED_OUTPATIENT_CLINIC_OR_DEPARTMENT_OTHER)
Admission: EM | Admit: 2019-01-16 | Discharge: 2019-01-16 | Disposition: A | Discharge status: Home / Self Care | Payer: 59 | Attending: Emergency Medicine | Admitting: Emergency Medicine

## 2019-01-16 ENCOUNTER — Other Ambulatory Visit: Payer: Self-pay

## 2019-01-16 DIAGNOSIS — R410 Disorientation, unspecified: Secondary | ICD-10-CM | POA: Insufficient documentation

## 2019-01-16 DIAGNOSIS — R4182 Altered mental status, unspecified: Secondary | ICD-10-CM | POA: Diagnosis present

## 2019-01-16 DIAGNOSIS — Z79899 Other long term (current) drug therapy: Secondary | ICD-10-CM | POA: Diagnosis not present

## 2019-01-16 DIAGNOSIS — E039 Hypothyroidism, unspecified: Secondary | ICD-10-CM | POA: Diagnosis not present

## 2019-01-16 DIAGNOSIS — R9401 Abnormal electroencephalogram [EEG]: Secondary | ICD-10-CM | POA: Diagnosis not present

## 2019-01-16 DIAGNOSIS — G454 Transient global amnesia: Secondary | ICD-10-CM | POA: Diagnosis not present

## 2019-01-16 DIAGNOSIS — R404 Transient alteration of awareness: Secondary | ICD-10-CM

## 2019-01-16 LAB — CBC WITH DIFFERENTIAL/PLATELET
Abs Immature Granulocytes: 0.04 10*3/uL (ref 0.00–0.07)
Basophils Absolute: 0.1 10*3/uL (ref 0.0–0.1)
Basophils Relative: 1 %
Eosinophils Absolute: 0.3 10*3/uL (ref 0.0–0.5)
Eosinophils Relative: 4 %
HCT: 46.8 % (ref 39.0–52.0)
Hemoglobin: 16 g/dL (ref 13.0–17.0)
Immature Granulocytes: 1 %
Lymphocytes Relative: 33 %
Lymphs Abs: 2.3 10*3/uL (ref 0.7–4.0)
MCH: 32.1 pg (ref 26.0–34.0)
MCHC: 34.2 g/dL (ref 30.0–36.0)
MCV: 93.8 fL (ref 80.0–100.0)
Monocytes Absolute: 0.8 10*3/uL (ref 0.1–1.0)
Monocytes Relative: 12 %
Neutro Abs: 3.4 10*3/uL (ref 1.7–7.7)
Neutrophils Relative %: 49 %
Platelets: 188 10*3/uL (ref 150–400)
RBC: 4.99 MIL/uL (ref 4.22–5.81)
RDW: 14.1 % (ref 11.5–15.5)
WBC: 6.9 10*3/uL (ref 4.0–10.5)
nRBC: 0 % (ref 0.0–0.2)

## 2019-01-16 LAB — COMPREHENSIVE METABOLIC PANEL
ALT: 47 U/L — ABNORMAL HIGH (ref 0–44)
AST: 30 U/L (ref 15–41)
Albumin: 4.3 g/dL (ref 3.5–5.0)
Alkaline Phosphatase: 50 U/L (ref 38–126)
Anion gap: 7 (ref 5–15)
BUN: 24 mg/dL — ABNORMAL HIGH (ref 8–23)
CO2: 24 mmol/L (ref 22–32)
Calcium: 9 mg/dL (ref 8.9–10.3)
Chloride: 106 mmol/L (ref 98–111)
Creatinine, Ser: 0.79 mg/dL (ref 0.61–1.24)
GFR calc Af Amer: >60 mL/min (ref >60)
GFR calc non Af Amer: >60 mL/min (ref >60)
Glucose, Bld: 108 mg/dL — ABNORMAL HIGH (ref 70–99)
Potassium: 3.9 mmol/L (ref 3.5–5.1)
Sodium: 137 mmol/L (ref 135–145)
Total Bilirubin: 1 mg/dL (ref 0.3–1.2)
Total Protein: 7 g/dL (ref 6.5–8.1)

## 2019-01-16 LAB — URINALYSIS, ROUTINE W REFLEX MICROSCOPIC
Bilirubin Urine: NEGATIVE
Glucose, UA: NEGATIVE mg/dL
Hgb urine dipstick: NEGATIVE
Ketones, ur: NEGATIVE mg/dL
Leukocytes,Ua: NEGATIVE
Nitrite: NEGATIVE
Protein, ur: NEGATIVE mg/dL
Specific Gravity, Urine: >1.03 — ABNORMAL HIGH (ref 1.005–1.030)
pH: 5.5 (ref 5.0–8.0)

## 2019-01-16 LAB — T4, FREE: Free T4: 0.77 ng/dL (ref 0.61–1.12)

## 2019-01-16 LAB — CBG MONITORING, ED: Glucose-Capillary: 104 mg/dL — ABNORMAL HIGH (ref 70–99)

## 2019-01-16 LAB — TSH: TSH: 2.971 u[IU]/mL (ref 0.350–4.500)

## 2019-01-16 MED ORDER — LEVETIRACETAM 500 MG PO TABS: 500.0000 mg | ORAL_TABLET | Freq: Two times a day (BID) | ORAL | 3 refills | Status: AC

## 2019-01-16 NOTE — ED Provider Notes (Signed)
[facility] EMERGENCY DEPARTMENT Provider Note   CSN: [id number] Arrival date & time: 01/16/19  0626     History Chief Complaint  Patient presents with  . Altered Mental Status    Ronnie Carr is a 64 y.o. male.  The history is provided by the patient. No language interpreter was used.  Altered Mental Status  Ronnie Carr is a 64 y.o. male who presents to the Emergency Department complaining of confusion. He presents to the emergency department complaining of confusion and difficulty focusing that began yesterday morning. He notices symptoms when he was driving to a doctors appointment and he became confused about where he was going. He also could not figure out how to work the audio on his truck. He thought that he might be fatigued and he laid down him took a nap when he awoke he had persistent symptoms. This morning he has ongoing symptoms but feels they may be slightly improved when compared to yesterday. He denies any fevers, headache, chest pain, Donald pain, nausea, vomiting, diarrhea, numbness, weakness. No prior similar symptoms. 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 He has a history of hypothyroidism, no additional medical problems. He drinks occasional alcohol. No tobacco or drugs. He lives at home with his wife.    Past Medical History:  Diagnosis Date  . Thyroid disease     Patient Active Problem List   Diagnosis Date Noted  . Perforated appendicitis 01/24/2013    Past Surgical History:  Procedure Laterality Date  . APPENDECTOMY    . FRACTURE SURGERY    . LAPAROSCOPIC APPENDECTOMY N/A 01/24/2013   Procedure: APPENDECTOMY LAPAROSCOPIC;  Surgeon: Ronnie Bowie, MD;  Location: [facility];  Service: General;  Laterality: N/A;       History reviewed. No pertinent family history.  Social History   Tobacco Use  . Smoking status: Never Smoker  . Smokeless tobacco: Never Used  Substance Use Topics  . Alcohol use: Yes    Comment:  occasionally  . Drug use: Never    Home Medications Prior to Admission medications   Medication Sig Start Date End Date Taking? Authorizing Provider  docusate sodium (COLACE) 100 MG capsule Take 1 capsule (100 mg total) by mouth 2 (two) times daily. 9/48/54   Leighton Ruff, MD  Doxylamine Succinate, Sleep, (SLEEP AID PO) Take 1 tablet by mouth at bedtime as needed (for sleep).    [provider]  ibuprofen (ADVIL,MOTRIN) 200 MG tablet Take 600 mg by mouth every 6 (six) hours as needed for moderate pain.    [provider]  levothyroxine (SYNTHROID, LEVOTHROID) 88 MCG tablet Take 88 mcg by mouth daily before breakfast.    [provider]    Allergies    Tetracyclines & related  Review of Systems   Review of Systems  All other systems reviewed and are negative.   Physical Exam Updated Vital Signs BP 136/74 (BP Location: Left Arm)   Pulse (!) 58   Temp 98.2 F (36.8 C) (Oral)   Resp 20   Ht 5\' 9"  (1.753 m)   Wt 108.9 kg   SpO2 98%   BMI 35.44 kg/m   Physical Exam Vitals and nursing note reviewed.  Constitutional:      Appearance: He is well-developed.  HENT:     Head: Normocephalic and atraumatic.  Eyes:     Extraocular Movements: Extraocular movements intact.     Pupils: Pupils are equal, round, and reactive to light.  Cardiovascular:  Rate and Rhythm: Normal rate and regular rhythm.     Heart sounds: No murmur.  Pulmonary:     Effort: Pulmonary effort is normal. No respiratory distress.     Breath sounds: Normal breath sounds.  Abdominal:     Palpations: Abdomen is soft.     Tenderness: There is no abdominal tenderness. There is no guarding or rebound.  Musculoskeletal:        General: No tenderness.  Skin:    General: Skin is warm and dry.  Neurological:     Mental Status: He is alert and oriented to person, place, and time.     Comments: No asymmetry of facial movements. EOM I. Visual fields are grossly intact. No pronated or  drift. Five out of five strength in all four extremities with sensation to light touch intact in all four extremities.  Psychiatric:        Behavior: Behavior normal.     ED Results / Procedures / Treatments   Labs (all labs ordered are listed, but only abnormal results are displayed) Labs Reviewed  COMPREHENSIVE METABOLIC PANEL - Abnormal; Notable for the following components:      Result Value   Glucose, Bld 108 (*)    BUN 24 (*)    ALT 47 (*)    All other components within normal limits  URINALYSIS, ROUTINE W REFLEX MICROSCOPIC - Abnormal; Notable for the following components:   Specific Gravity, Urine >1.030 (*)    All other components within normal limits  CBG MONITORING, ED - Abnormal; Notable for the following components:   Glucose-Capillary 104 (*)    All other components within normal limits  CBC WITH DIFFERENTIAL/PLATELET  CBG MONITORING, ED    EKG EKG Interpretation  Date/Time:  Thursday January 16 2019 07:37:18 EST Ventricular Rate:  61 PR Interval:    QRS Duration: 87 QT Interval:  412 QTC Calculation: 415 R Axis:   6 Text Interpretation: Sinus rhythm Low voltage, precordial leads Consider anterior infarct Confirmed by Tilden Fossa [phone]) on 01/16/2019 7:49:09 AM   Radiology CT Head Wo Contrast  Result Date: 01/16/2019 CLINICAL DATA:  Confusion and cognitive impairment. EXAM: CT HEAD WITHOUT CONTRAST TECHNIQUE: Contiguous axial images were obtained from the base of the skull through the vertex without intravenous contrast. COMPARISON:  None FINDINGS: Brain: Generalized cerebral atrophy. No signs of acute intracranial abnormality. No evidence of mass, mass effect, midline shift or hydrocephalus. No signs of intracranial hemorrhage. Vascular: No hyperdense vessel or unexpected calcification. Skull: Normal. Negative for fracture or focal lesion. Sinuses/Orbits: No acute finding within visualized portions. Very limited imaging of sinuses and orbits. The. Other:  None IMPRESSION: 1. No acute intracranial abnormality. 2. Generalized cerebral atrophy. Electronically Signed   By: Donzetta Kohut M.D.   On: 01/16/2019 08:13    Procedures Procedures (including critical care time)  Medications Ordered in ED Medications - No data to display  ED Course  I have reviewed the triage vital signs and the nursing notes.  Pertinent labs & imaging results that were available during my care of the patient were reviewed by me and considered in my medical decision making (see chart for details).    MDM Rules/Calculators/A&P                     Patient here for evaluation of confusion, onset yesterday morning. He is non-toxic appearing on evaluation with no focal neurologic deficits. CT head is negative for acute abnormality. Discussed the case with Dr.  Aroor with neurology, recommends ED to ED transfer for MRI brain as well as neurology consult. Patient updated findings of studies and recommendation for transfer and he is in agreement with treatment plan. His wife is contacted as well. His wife will provide transportation to the [facility] emergency department.  Discussed with Dr. Silverio Lay, who accepts the patient in transfer.  Final Clinical Impression(s) / ED Diagnoses Final diagnoses:  Confusion    Rx / DC Orders ED Discharge Orders    None       Tilden Fossa, MD 01/16/19 1351

## 2019-01-16 NOTE — Discharge Instructions (Addendum)
You were evaluated in the Emergency Department and after careful evaluation, we did not find any emergent condition requiring admission or further testing in the hospital.  Your exam/testing today is overall reassuring.  Your evaluated by the neurologists who found some abnormalities on your EEG.  Please take the Keppra provided as directed and follow-up with the neurologists.  Please return to the Emergency Department if you experience any worsening of your condition.  We encourage you to follow up with a primary care provider.  Thank you for allowing [place] to be a part of your care.

## 2019-01-16 NOTE — ED Notes (Signed)
Patient verbalizes understanding of discharge instructions. Opportunity for questioning and answers were provided. Armband removed by staff, pt discharged from ED. Pt. ambulatory and discharged home.  

## 2019-01-16 NOTE — ED Notes (Signed)
Patient transported to- MRI from [facility]

## 2019-01-16 NOTE — ED Triage Notes (Signed)
To ED via POV from [facility]. Pt states he went there about 0700 today due to feeling as if he couldn't concentrate on tasks since yesterday. States he feels this has completely resolved as of 1000 today. Pt without neuro deficits. Recalls past and recent events without difficulty. No HA. Evaluated for similar in 2012 without dx. Appears in nad

## 2019-01-16 NOTE — Procedures (Signed)
Patient Name: Ronnie Carr  MRN: [id number]  Epilepsy Attending: Charlsie Quest  Referring Physician/Provider: Dr Lindie Spruce Date: 01/15/2018 Duration: 48.27 mins  Patient history: 64YO  M with transient alteration of awareness. EEG to evaluate for seizure  Level of alertness: awake, drowsy  AEDs during EEG study: None  Technical aspects: This EEG study was done with scalp electrodes positioned according to the 10-20 International system of electrode placement. Electrical activity was acquired at a sampling rate of 500Hz  and reviewed with a high frequency filter of 70Hz  and a low frequency filter of 1Hz . EEG data were recorded continuously and digitally stored.   DESCRIPTION: During awake state, the posterior dominant rhythm consists of 9-10 Hz activity of moderate voltage (25-35 uV) seen predominantly in posterior head regions, symmetric and reactive to eye opening and eye closing. Drowsiness was characterized by attenuation of the posterior background rhythm. EEG also showed near continuous generalized, maximal bifrontal 5-6Hz  high amplitude theta slowing. At times, generalized, maximal bifrontal spike and wave like activity was also noted, accentuated by drowsiness. The spike and wave do not look definitively epileptic but potential epileptogenicity cannot be ruled out.    No EEG change was seen during hyperventilation. Physiologic photic driving was not seen during photic stimulation.  IMPRESSION: This study showed evidence of mild diffuse encephalopathy, non specific to etiology. There was also spike and wave like activity noted which did not look definitively epileptic but potential epileptogenicity cannot be ruled out. Repeat EEG at a later date can be considered. No seizures were seen throughout the recording.  [name] 

## 2019-01-16 NOTE — ED Notes (Signed)
Returned from MRI 

## 2019-01-16 NOTE — ED Provider Notes (Signed)
  Provider Note MRN:  [id number]  Arrival date & time: 01/16/19    ED Course and Medical Decision Making  Assumed care from Dr. Juleen China at shift change.  Confusion, MRI negative, awaiting neurology evaluation and recommendations.  5 PM update: Evaluated by neurology and with EEG monitoring, which was found to have some abnormalities.  Per neurology consultant Dr. Lorrin Jackson, patient appropriate for outpatient follow-up and starting Keppra 500 twice daily.  Prescription provided.   Procedures  Final Clinical Impressions(s) / ED Diagnoses     ICD-10-CM   1. Confusion  R41.0   2. Abnormal EEG  R94.01     ED Discharge Orders         Ordered    levETIRAcetam (KEPPRA) 500 MG tablet  2 times daily     01/16/19 1707            Discharge Instructions     You were evaluated in the Emergency Department and after careful evaluation, we did not find any emergent condition requiring admission or further testing in the hospital.  Your exam/testing today is overall reassuring.  Your evaluated by the neurologists who found some abnormalities on your EEG.  Please take the Keppra provided as directed and follow-up with the neurologists.  Please return to the Emergency Department if you experience any worsening of your condition.  We encourage you to follow up with a primary care provider.  Thank you for allowing [place] to be a part of your care.     Elmer Sow. Pilar Plate, MD [facility] Emergency Medicine [facility] [email or web address]    Sabas Sous, MD 01/16/19 [phone]

## 2019-01-16 NOTE — Progress Notes (Signed)
EEG complete - results pending 

## 2019-01-16 NOTE — Consult Note (Addendum)
Neurology Consultation Reason for Consult: alteration of awareness Referring Physician: Dr Raeford Razor  CC: alteration of awareness  History is obtained from:patient  HPI: Ronnie Carr is a 64 y.o. right hand male with h/o hypothyroidism who presented to ED after an episode of alteration of awareness yesterday. Patient states yesterday morning around 11am he was driving and suddenly felt confused, had GPS on his phone but couldn't understand directions.  He immediately pulled over and called his wife and decided to go back home. He states he felt the episode was severe for about few seconds but he was still feeling little confused till about 5pm and didn't feel completely back to baseline till this morning. Reports having similar episode in 2012. States he was very stressed due to work  when it happened it 2012 and is under similar stress currently. Denies any dizziness, light headedness, palpitations, SOB, headache, etc prior to the episode. Denies any recent change in meds, recent travel. Denies prior seizure, auras.   ROS: A 14 point ROS was performed and is negative except as noted in the HPI.   Past Medical History:  Diagnosis Date  . Thyroid disease     History reviewed. No pertinent family history. Denies h/o stroke, epilepsy/seizure in family   Social History:  reports that he has never smoked. He has never used smokeless tobacco. He reports current alcohol use. He reports that he does not use drugs.  Exam: Current vital signs: BP 114/73   Pulse (!) 59   Temp 98.2 F (36.8 C) (Oral)   Resp 18   Ht 5\' 9"  (1.753 m)   Wt 108.9 kg   SpO2 98%   BMI 35.44 kg/m  Vital signs in last 24 hours: Temp:  [98.2 F (36.8 C)] 98.2 F (36.8 C) (01/07 0703) Pulse Rate:  [58-65] 59 (01/07 0930) Resp:  [18-20] 18 (01/07 0930) BP: (114-144)/(73-87) 114/73 (01/07 0930) SpO2:  [98 %] 98 % (01/07 0930) Weight:  [108.9 kg] 108.9 kg (01/07 0704)   Physical Exam  Constitutional: Appears  well-developed and well-nourished.  Psych: Affect appropriate to situation Eyes: No scleral injection HENT: No OP obstrucion Head: Normocephalic.  Cardiovascular: Normal rate and regular rhythm.  Respiratory: Effort normal, non-labored breathing GI: Soft.  No distension. There is no tenderness.  Skin: WDI  Neuro: Mental Status: Patient is awake, alert, oriented to person, place, month, year, and situation. Patient is able to give a clear and coherent history. No signs of aphasia or neglect Cranial Nerves: II: Visual Fields are full. Pupils are equal, round, and reactive to light.  III,IV, VI: EOMI without ptosis or diploplia.  V: Facial sensation is symmetric to temperature VII: Facial movement is symmetric.  VIII: hearing is intact to voice X: Uvula elevates symmetrically XI: Shoulder shrug is symmetric. XII: tongue is midline without atrophy or fasciculations.  Motor:  5/5 strength was present in all four extremities.  Cerebellar: FNF  are intact bilaterally  I have reviewed labs in epic and the results pertinent to this consultation are: BG 108 BUN 24, Sr Creat 0.79  I have reviewed the images obtained: MR Brain 01/16/2019: No evidence of recent infarction, intracranial hemorrhage, or mass.  CTH 01/16/2019: 1. No acute intracranial abnormality. 2. Generalized cerebral atrophy.   ASSESSMENT/PLAN: 64YO  M with transient alteration of awareness.  Transient alteration of awareness Hypothyroidism - Patient had transient alteration of awareness with complete return to baseline.  - MRI brain didn't show any evidence of infarct - Will order  EEG to assess for any potential epileptogenicity although less likely  - Patient also states he hasnt had his thyroid function checked in over a year. Although less likely, will check TSH, T3 and T4 - Will also order UDS to rule out potential drug induced etiology  - If eeg normal, patient can be discharged home - If all tests are within  normal limits, patient can be discharged home.  ADDENDUM - EEG showed generalized, maximal bifrontal spike and slow wave discharges which are atypical appearing and although not definitively epileptic do raise concern for potential epileptogenicity given patient's history.  - Discussed starting AEDs as patient has had similar episode in past vs waiting ( previous such episode was in 2012) and patient agreed to start Keppra 500mg  BID - we will repeat EEG in 3 months to assess for any eeg change and if eeg appears improved, will continue keppra - If eeg appears similar, its possible patient had transient global amnesia and the eeg abnormality is just incidental. At that time, we can consider weaning off keppra slowly. - Discussed seizure precautions including DO NOT DRIVE for 6 months per [place] law   Thank you for allowing [place] to participate in the care of this patient. Please page neurohospitalist for any further questions after [place]

## 2019-01-16 NOTE — ED Triage Notes (Signed)
Patient arrived via POV c/o confustion and cognitive impairment starting at 1100 yesterday 01/15/19.  Patient states he was driving to doctors appointment when he forgot where he was going and how to operate his car. Patient states he woke this morning with similar cognitive impairment.

## 2019-01-16 NOTE — ED Notes (Addendum)
Pt sent by private vehicle to [facility] for MRI and  further evaluation accepted by Dr Silverio Lay.  IV to remain in place, wrapped for safety.  Wife to transport, pt understands need to go directly to ER with no stops.  Report Called to Clydie Braun, Charity fundraiser charge at [facility]

## 2019-01-16 NOTE — ED Notes (Signed)
Provided urinal for specimen collection

## 2019-01-17 LAB — T3, FREE: T3, Free: 6.4 pg/mL — ABNORMAL HIGH (ref 2.0–4.4)

## 2019-01-17 NOTE — Plan of Care (Signed)
I called and notified patient of his TSH, T4 and T3 results.  I asked if patient would like me to share these results with his PCP.  Patient noted down the results and stated he would call his PCP to schedule follow-up.  I also inquired if patient is tolerating Keppra.  He stated yes and denied any side effects.  I told patient to call me back if he has any further questions.  Abelina Ketron Annabelle Harman
